# Patient Record
Sex: Female | Born: 1965 | Race: White | Hispanic: No | Marital: Married | Smoking: Never smoker
Health system: Southern US, Community
[De-identification: ages and names within clinical notes are randomized; demographics above are authoritative.]

## PROBLEM LIST (undated history)

## (undated) DIAGNOSIS — M1990 Unspecified osteoarthritis, unspecified site: Secondary | ICD-10-CM

## (undated) DIAGNOSIS — I2510 Atherosclerotic heart disease of native coronary artery without angina pectoris: Secondary | ICD-10-CM

## (undated) DIAGNOSIS — E785 Hyperlipidemia, unspecified: Secondary | ICD-10-CM

## (undated) DIAGNOSIS — I10 Essential (primary) hypertension: Secondary | ICD-10-CM

## (undated) DIAGNOSIS — M79602 Pain in left arm: Secondary | ICD-10-CM

## (undated) HISTORY — DX: Hyperlipidemia, unspecified: E78.5

## (undated) HISTORY — PX: CORONARY STENT PLACEMENT: SHX1402

## (undated) HISTORY — DX: Unspecified osteoarthritis, unspecified site: M19.90

## (undated) HISTORY — DX: Essential (primary) hypertension: I10

## (undated) HISTORY — DX: Atherosclerotic heart disease of native coronary artery without angina pectoris: I25.10

## (undated) HISTORY — DX: Pain in left arm: M79.602

---

## 2010-08-22 ENCOUNTER — Inpatient Hospital Stay (HOSPITAL_COMMUNITY): Admission: RE | Admit: 2010-08-22 | Discharge: 2010-08-25 | Payer: Self-pay | Admitting: Obstetrics and Gynecology

## 2010-08-25 ENCOUNTER — Encounter: Admission: RE | Admit: 2010-08-25 | Discharge: 2010-09-04 | Payer: Self-pay | Admitting: Obstetrics and Gynecology

## 2010-08-28 ENCOUNTER — Ambulatory Visit
Admission: RE | Admit: 2010-08-28 | Discharge: 2010-08-28 | Payer: Self-pay | Source: Home / Self Care | Admitting: Obstetrics and Gynecology

## 2011-02-06 LAB — CBC
MCH: 30.4 pg (ref 26.0–34.0)
MCH: 30.5 pg (ref 26.0–34.0)
MCHC: 33.6 g/dL (ref 30.0–36.0)
MCHC: 34 g/dL (ref 30.0–36.0)
MCV: 89.1 fL (ref 78.0–100.0)
Platelets: 177 10*3/uL (ref 150–400)
Platelets: 180 10*3/uL (ref 150–400)
RBC: 3.02 MIL/uL — ABNORMAL LOW (ref 3.87–5.11)
RDW: 16.8 % — ABNORMAL HIGH (ref 11.5–15.5)
RDW: 17.3 % — ABNORMAL HIGH (ref 11.5–15.5)

## 2011-02-06 LAB — RPR: RPR Ser Ql: NONREACTIVE

## 2011-05-30 ENCOUNTER — Telehealth: Payer: Self-pay | Admitting: Cardiovascular Disease

## 2011-05-30 NOTE — Telephone Encounter (Addendum)
ROI faxed to [facility] @ [phone] & [facility] at [phone] 05/30/11/km  Records received from Both [facility] & [facility] gave to [facility] 05/30/11/km

## 2011-06-05 ENCOUNTER — Encounter: Payer: Self-pay | Admitting: Cardiovascular Disease

## 2011-06-06 ENCOUNTER — Encounter: Payer: Self-pay | Admitting: Cardiovascular Disease

## 2011-06-06 ENCOUNTER — Ambulatory Visit (INDEPENDENT_AMBULATORY_CARE_PROVIDER_SITE_OTHER): Payer: Managed Care, Other (non HMO) | Admitting: Cardiovascular Disease

## 2011-06-06 DIAGNOSIS — E782 Mixed hyperlipidemia: Secondary | ICD-10-CM

## 2011-06-06 DIAGNOSIS — I10 Essential (primary) hypertension: Secondary | ICD-10-CM | POA: Insufficient documentation

## 2011-06-06 DIAGNOSIS — R079 Chest pain, unspecified: Secondary | ICD-10-CM | POA: Insufficient documentation

## 2011-06-06 NOTE — Assessment & Plan Note (Signed)
Cholesterol is at goal.  Continue current dose of statin and diet Rx.  No myalgias or side effects.  F/U  LFT's in 6 months. No results found for this basename: LDLCALC  LDL 161 on 11/11 and started back on statin which she had stopped during pregnancy

## 2011-06-06 NOTE — Patient Instructions (Signed)
Your physician has requested that you have cardiac CT. Cardiac computed tomography (CT) is a painless test that uses an x-ray machine to take clear, detailed pictures of your heart. For further information please visit www.cardiosmart.org. Please follow instruction sheet as given.   

## 2011-06-06 NOTE — Assessment & Plan Note (Signed)
Well controlled.  Continue current medications and low sodium Dash type diet.    

## 2011-06-06 NOTE — Progress Notes (Signed)
45 yo self referred for 2nd opinion regarding heart cath.  Seen at [facility] cards in March.  Had SSCP radiating to neck.  CRF;s elevated cholesterol, HTN  And family history.  She apparantly had a CT 5 years ago with "coronary plaque".  Reviewed calcium score done 06/2006 and score was only 5.6 with one plaque in circumflex.   Reviewed records from echart and [facility].  Stress echo with 1 mm ST segment depression and ? Apical hypokinesis Since January has had exertional left neck, chest and shoulder pain radiating to arm.  Always exertional.  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 Not clearly positional.  Pain is persistant and not improving  She clearly needs further w/u given risk factors new onset and persistant exertional pain and positive stress echo.  She wants to avoid invasive cath.  Would be reasonable to do cardiac CT since probability of disease is still low to moderate and not high.  Would also be able to compare calcium score form 5 years ago.    ROS: Denies fever, malais, weight loss, blurry vision, decreased visual acuity, cough, sputum, SOB, hemoptysis, pleuritic pain, palpitaitons, heartburn, abdominal pain, melena, lower extremity edema, claudication, or rash.  All other systems reviewed and negative   General: Affect appropriate Healthy:  appears stated age HEENT: normal Neck supple with no adenopathy JVP normal no bruits no thyromegaly Lungs clear with no wheezing and good diaphragmatic motion Heart:  S1/S2 no murmur,rub, gallop or click PMI normal Abdomen: benighn, BS positve, no tenderness, no AAA no bruit.  No HSM or HJR Distal pulses intact with no bruits No edema Neuro non-focal Skin warm and dry No muscular weakness  Medications No current outpatient prescriptions on file.    Allergies Review of patient's allergies indicates not on file.  Family History: Family History  Problem Relation Age of Onset  . Heart disease Neg Hx     Social History: History     Social History  . Marital Status: Married    Spouse Name: N/A    Number of Children: N/A  . Years of Education: N/A   Occupational History  . Not on file.   Social History Main Topics  . Smoking status: Never Smoker   . Smokeless tobacco: Not on file  . Alcohol Use: Yes  . Drug Use: No  . Sexually Active:    Other Topics Concern  . Not on file   Social History Narrative  . No narrative on file    Electrocardiogram:  NSR 73 normal ECG 05/19/11  Assessment and Plan

## 2011-06-06 NOTE — Assessment & Plan Note (Signed)
Will try to schedule cardiac CT.  If not approved will need invasive cath as suggested by [facility] cardiology

## 2011-06-12 ENCOUNTER — Encounter (HOSPITAL_COMMUNITY): Payer: Self-pay

## 2011-06-12 ENCOUNTER — Telehealth: Payer: Self-pay | Admitting: *Deleted

## 2011-06-12 ENCOUNTER — Ambulatory Visit (HOSPITAL_COMMUNITY)
Admission: RE | Admit: 2011-06-12 | Discharge: 2011-06-12 | Disposition: A | Discharge status: Home / Self Care | Payer: Managed Care, Other (non HMO) | Source: Ambulatory Visit | Attending: Cardiovascular Disease | Admitting: Cardiovascular Disease

## 2011-06-12 DIAGNOSIS — I10 Essential (primary) hypertension: Secondary | ICD-10-CM

## 2011-06-12 DIAGNOSIS — R079 Chest pain, unspecified: Secondary | ICD-10-CM

## 2011-06-12 DIAGNOSIS — E782 Mixed hyperlipidemia: Secondary | ICD-10-CM | POA: Insufficient documentation

## 2011-06-12 MED ORDER — IOHEXOL 350 MG/ML SOLN
80.0000 mL | Freq: Once | INTRAVENOUS | Status: AC | PRN
  Administered 2011-06-12: 80 mL via INTRAVENOUS

## 2011-06-12 NOTE — Telephone Encounter (Signed)
Dr Eden Emms called, he did a cardiac CT on the pt today and she will need to have a left heart cath in the [facility] lab next week with him. The pt will need to call back to schedule Deliah Goody

## 2011-06-13 ENCOUNTER — Other Ambulatory Visit: Payer: Self-pay | Admitting: *Deleted

## 2011-06-13 ENCOUNTER — Encounter: Payer: Self-pay | Admitting: *Deleted

## 2011-06-13 DIAGNOSIS — Z01810 Encounter for preprocedural cardiovascular examination: Secondary | ICD-10-CM

## 2011-06-13 DIAGNOSIS — R931 Abnormal findings on diagnostic imaging of heart and coronary circulation: Secondary | ICD-10-CM

## 2011-06-13 NOTE — Telephone Encounter (Signed)
PT CALLED THIS AM LEFT HEART SCHEDULED WITH DR Eden Emms  ON 06-20-11 AT 9:30 AM  PT AWARE AND WILL HAVE PRE PROCEDURE LABS DONE ON MON 06-16-11 AT 3:30 PM

## 2011-06-16 ENCOUNTER — Other Ambulatory Visit (INDEPENDENT_AMBULATORY_CARE_PROVIDER_SITE_OTHER): Payer: Managed Care, Other (non HMO) | Admitting: *Deleted

## 2011-06-16 DIAGNOSIS — Z01810 Encounter for preprocedural cardiovascular examination: Secondary | ICD-10-CM

## 2011-06-16 LAB — CBC WITH DIFFERENTIAL/PLATELET
Basophils Absolute: 0 10*3/uL (ref 0.0–0.1)
Basophils Relative: 0.5 % (ref 0.0–3.0)
Eosinophils Absolute: 0.1 10*3/uL (ref 0.0–0.7)
Eosinophils Relative: 1.2 % (ref 0.0–5.0)
HCT: 40.5 % (ref 36.0–46.0)
Hemoglobin: 13.8 g/dL (ref 12.0–15.0)
Lymphocytes Relative: 30.9 % (ref 12.0–46.0)
Lymphs Abs: 1.3 10*3/uL (ref 0.7–4.0)
MCHC: 33.9 g/dL (ref 30.0–36.0)
MCV: 94.5 fl (ref 78.0–100.0)
Monocytes Absolute: 0.4 10*3/uL (ref 0.1–1.0)
Monocytes Relative: 9.7 % (ref 3.0–12.0)
Neutro Abs: 2.4 10*3/uL (ref 1.4–7.7)
Neutrophils Relative %: 57.7 % (ref 43.0–77.0)
Platelets: 149 10*3/uL — ABNORMAL LOW (ref 150.0–400.0)
RBC: 4.29 Mil/uL (ref 3.87–5.11)
RDW: 13 % (ref 11.5–14.6)
WBC: 4.1 10*3/uL — ABNORMAL LOW (ref 4.5–10.5)

## 2011-06-16 LAB — BASIC METABOLIC PANEL
CO2: 29 mEq/L (ref 19–32)
Calcium: 8.8 mg/dL (ref 8.4–10.5)
Chloride: 98 mEq/L (ref 96–112)
Creatinine, Ser: 0.8 mg/dL (ref 0.4–1.2)
Glucose, Bld: 94 mg/dL (ref 70–99)

## 2011-06-16 LAB — APTT: aPTT: 28.7 s (ref 21.7–28.8)

## 2011-06-17 ENCOUNTER — Encounter: Payer: Self-pay | Admitting: *Deleted

## 2011-06-20 ENCOUNTER — Telehealth: Payer: Self-pay | Admitting: Cardiovascular Disease

## 2011-06-20 ENCOUNTER — Inpatient Hospital Stay (HOSPITAL_BASED_OUTPATIENT_CLINIC_OR_DEPARTMENT_OTHER)
Admission: RE | Admit: 2011-06-20 | Discharge: 2011-06-20 | Disposition: A | Discharge status: Home / Self Care | Payer: Managed Care, Other (non HMO) | Source: Ambulatory Visit | Attending: Cardiovascular Disease | Admitting: Cardiovascular Disease

## 2011-06-20 DIAGNOSIS — I2510 Atherosclerotic heart disease of native coronary artery without angina pectoris: Secondary | ICD-10-CM

## 2011-06-20 MED ORDER — PRASUGREL HCL 10 MG PO TABS: ORAL_TABLET | ORAL | Status: DC

## 2011-06-20 NOTE — Telephone Encounter (Signed)
Per Dr Eden Emms pt needs Effient 60 mg tonight then 10 mg daily rx sent into [facility] pt is aware

## 2011-06-20 NOTE — Telephone Encounter (Signed)
Was seen at [facility] for cath today/stent on Monday.  Prescription for blood thinner was suppose to be called into [facility] on [address].  This has not been done.  Please ck and call this in and advise patient when this is done.

## 2011-06-22 ENCOUNTER — Emergency Department (HOSPITAL_COMMUNITY)
Admission: EM | Admit: 2011-06-22 | Discharge: 2011-06-23 | Disposition: A | Discharge status: Home / Self Care | Payer: Managed Care, Other (non HMO) | Source: Home / Self Care | Attending: Emergency Medicine | Admitting: Emergency Medicine

## 2011-06-22 DIAGNOSIS — Z79899 Other long term (current) drug therapy: Secondary | ICD-10-CM | POA: Insufficient documentation

## 2011-06-22 DIAGNOSIS — I519 Heart disease, unspecified: Secondary | ICD-10-CM | POA: Insufficient documentation

## 2011-06-22 DIAGNOSIS — E7800 Pure hypercholesterolemia, unspecified: Secondary | ICD-10-CM | POA: Insufficient documentation

## 2011-06-22 DIAGNOSIS — I10 Essential (primary) hypertension: Secondary | ICD-10-CM | POA: Insufficient documentation

## 2011-06-22 DIAGNOSIS — Z7982 Long term (current) use of aspirin: Secondary | ICD-10-CM | POA: Insufficient documentation

## 2011-06-22 DIAGNOSIS — R110 Nausea: Secondary | ICD-10-CM | POA: Insufficient documentation

## 2011-06-22 DIAGNOSIS — G43909 Migraine, unspecified, not intractable, without status migrainosus: Secondary | ICD-10-CM | POA: Insufficient documentation

## 2011-06-23 ENCOUNTER — Ambulatory Visit (HOSPITAL_COMMUNITY)
Admission: RE | Admit: 2011-06-23 | Discharge: 2011-06-24 | Disposition: A | Discharge status: Home / Self Care | Payer: Managed Care, Other (non HMO) | Source: Ambulatory Visit | Attending: Cardiovascular Disease | Admitting: Cardiovascular Disease

## 2011-06-23 DIAGNOSIS — I2510 Atherosclerotic heart disease of native coronary artery without angina pectoris: Secondary | ICD-10-CM

## 2011-06-23 DIAGNOSIS — I2582 Chronic total occlusion of coronary artery: Secondary | ICD-10-CM | POA: Insufficient documentation

## 2011-06-23 DIAGNOSIS — I200 Unstable angina: Secondary | ICD-10-CM | POA: Insufficient documentation

## 2011-06-23 DIAGNOSIS — Z01812 Encounter for preprocedural laboratory examination: Secondary | ICD-10-CM | POA: Insufficient documentation

## 2011-06-23 DIAGNOSIS — Z01810 Encounter for preprocedural cardiovascular examination: Secondary | ICD-10-CM | POA: Insufficient documentation

## 2011-06-23 DIAGNOSIS — I498 Other specified cardiac arrhythmias: Secondary | ICD-10-CM | POA: Insufficient documentation

## 2011-06-23 DIAGNOSIS — E785 Hyperlipidemia, unspecified: Secondary | ICD-10-CM | POA: Insufficient documentation

## 2011-06-23 DIAGNOSIS — I10 Essential (primary) hypertension: Secondary | ICD-10-CM | POA: Insufficient documentation

## 2011-06-23 LAB — CBC
MCH: 31.3 pg (ref 26.0–34.0)
MCV: 91.1 fL (ref 78.0–100.0)
Platelets: 147 10*3/uL — ABNORMAL LOW (ref 150–400)
RBC: 4.16 MIL/uL (ref 3.87–5.11)
RDW: 12.5 % (ref 11.5–15.5)
WBC: 4.3 10*3/uL (ref 4.0–10.5)

## 2011-06-23 LAB — BASIC METABOLIC PANEL
BUN: 16 mg/dL (ref 6–23)
CO2: 26 mEq/L (ref 19–32)
Calcium: 8.9 mg/dL (ref 8.4–10.5)
Creatinine, Ser: 0.76 mg/dL (ref 0.50–1.10)
GFR calc non Af Amer: >60 mL/min (ref >60)
Glucose, Bld: 88 mg/dL (ref 70–99)
Sodium: 143 mEq/L (ref 135–145)

## 2011-06-23 LAB — PROTIME-INR: Prothrombin Time: 12.6 seconds (ref 11.6–15.2)

## 2011-06-24 DIAGNOSIS — I200 Unstable angina: Secondary | ICD-10-CM

## 2011-06-24 LAB — CBC
HCT: 37.9 % (ref 36.0–46.0)
MCH: 31 pg (ref 26.0–34.0)
MCHC: 33.8 g/dL (ref 30.0–36.0)
MCV: 91.8 fL (ref 78.0–100.0)
Platelets: 145 10*3/uL — ABNORMAL LOW (ref 150–400)
RDW: 12.6 % (ref 11.5–15.5)
WBC: 4.2 10*3/uL (ref 4.0–10.5)

## 2011-06-24 LAB — BASIC METABOLIC PANEL
BUN: 12 mg/dL (ref 6–23)
Calcium: 9 mg/dL (ref 8.4–10.5)
Chloride: 105 mEq/L (ref 96–112)
Creatinine, Ser: 0.88 mg/dL (ref 0.50–1.10)
GFR calc Af Amer: >60 mL/min (ref >60)
GFR calc non Af Amer: >60 mL/min (ref >60)

## 2011-06-24 NOTE — Cardiovascular Report (Signed)
NAMEROYALTI, SCHAUF NO.:  [id number]  MEDICAL RECORD NO.:  [id number]  LOCATION:  6525                         FACILITY:  [facility]  PHYSICIAN:  Verne Carrow, MDDATE OF BIRTH:  1966-01-29  DATE OF PROCEDURE:  06/23/2011 DATE OF DISCHARGE:                           CARDIAC CATHETERIZATION   PRIMARY CARDIOLOGIST:  Theron Arista C. Eden Emms, MD, [facility]  PROCEDURE PERFORMED: 1. Percutaneous transluminal coronary angioplasty with placement of     three overlapping drug-eluting stents in the first diagonal branch     of the left anterior descending artery. 2. Placement of an Angio-Seal femoral artery closure device.  OPERATOR:  Verne Carrow, MD  INDICATIONS:  This is a 45 year old Caucasian female with recent jaw pain consistent with unstable angina.  She underwent a diagnostic catheterization per Dr. Charlton Haws in the outpatient cath lab last week.  She was found to have a totally occluded circumflex artery which was felt to be chronic secondary to collateralization.  She was also found to have a high-grade stenosis in the first diagonal branch. Percutaneous intervention was arranged for today.  PROCEDURE IN DETAIL:  The patient was brought to the main cardiac catheterization laboratory after signing informed consent for the procedure.  The right groin was prepped, draped in sterile fashion. Lidocaine 1% was used for local anesthesia.  A 6-French sheath was inserted into the right femoral artery without difficulty.  The patient was given a bolus of Angiomax and a drip started.  The patient had been loaded with 300 mg of Plavix earlier this morning.  Of note, she has been loaded with Effient last week and has taken Effient throughout the weekend.  However, she has felt nausea and felt that this was related to her Effient.  She did not want to retake the Effient today.  When the ACT was greater than 200, I selectively engaged the left main artery with an XB  LAD 3.5 guiding catheter.  A cougar intracoronary wire was passed down the LAD and into the diagonal branch but we were unable to cross the stenosis.  We then used a whisper wire to cross the stenosis in the diagonal branch.  A 2.0 x 8-mm balloon was used to predilate the lesion.  Flow was totally shut down in the vessel with placement of the wire across the tight stenosis.  Therefore, it was difficult to center the balloon over the lesion.  After several balloon inflations, flow was restored in the vessel.  I originally had hoped to leave this as a balloon angioplasty alone, however, there appeared to be haziness in the proximal vessel consistent with dissection.  We chose to place a stent. A 2.25 x 12 mm Resolute Integrity drug-eluting stent was positioned in the proximal portion of the vessel and deployed.  I did not come all the way back to the ostium as I did not feel that the ostium had severe disease.  After this stent was postdilated with a 2.25 x 8 mm noncompliant balloon, I was concerned about the ostium of the vessel. We then placed a 2.25 x 8 mm Resolute Integrity drug-eluting stent in the ostium of the diagonal branch overlapping with the first  stent. There then appeared to be a dissection beyond the stent.  A 2.25 x 18 mm Resolute Integrity drug-eluting stent was positioned in the mid vessel overlapping with the previously placed stents and was deployed without difficulty.  The stenosis was taken from 99% down to 0%.  There was an excellent flow into the vessel.  I do not feel there was any residual dissection flap.  The patient tolerated the procedure well.  She was without any symptoms at the time of leaving the cath lab.  There were no immediate complications.  The patient was hemodynamically stable.  IMPRESSION:  Successful percutaneous transluminal coronary angioplasty with placement of three overlapping drug-eluting stents in the diagonal branch of the left anterior  descending artery.  RECOMMENDATIONS:  The patient should be continued on aspirin, Plavix and statin.  We will not restart Effient as the patient felt that this caused her to be nauseous.  We will monitor her on the heart monitor tonight and if her heart rate tolerates, we will add low-dose beta- blocker.     Verne Carrow, MD     CM/MEDQ  D:  06/23/2011  T:  06/23/2011  Job:  [id number]  cc:   Noralyn Pick. Eden Emms, MD, [facility]  Electronically Signed by Verne Carrow MD on 06/24/2011 01:45:50 PM

## 2011-06-25 NOTE — Discharge Summary (Addendum)
NAMEMARIKA, MAHAFFY NO.:  [id number]  MEDICAL RECORD NO.:  [id number]  LOCATION:  6525                         FACILITY:  [facility]  PHYSICIAN:  Verne Carrow, MDDATE OF BIRTH:  1966/10/06  DATE OF ADMISSION:  06/23/2011 DATE OF DISCHARGE:  06/24/2011                              DISCHARGE SUMMARY   DISCHARGE DIAGNOSES: 1. Unstable angina. 2. Newly diagnosed coronary artery disease.     a.     Abnormal coronary CT as an outpatient, leading to diagnostic      catheterization June 20, 2011, showing chronically occluded      circumflex, 90% diagonal stenosis.     b.     Status post PTCA with 3 overlapping drug-eluting stents,      diagnosed June 23, 2011.     c.     The patient had nausea with Effient, changed to Plavix. 3. Hypertension. 4. Hyperlipidemia. 5. Sinus bradycardia.  HOSPITAL COURSE:  Ms. Acheampong is a 45 year old female with a history of hyperlipidemia and hypertension.  She was seen with the same Cardiology in March and had substernal chest pain radiating to her neck.  She apparently had CT 5 years ago with coronary plaque.  She did have a stress echo with 1-mm ST-segment depression and questionable apical hypokinesis.  Since January, she has had exertional left neck, chest, and shoulder pain radiating to her arm.  She was subsequently referred for cardiac CT by Dr. Eden Emms demonstrating very high calcium score of 397 for age and sex-match control precisely, significant stenosis with plaque in the proximal circumflex artery.  Arrangements to have catheterization was made and she was brought in for the diagnostic portion on June 20, 2011 demonstrating the above findings.  She was loaded with Effient.  However, she did have nausea with Effient, so that this was changed to Plavix upon admission in the hospital at this time. She ended up with successful PTCA and placement of 3 overlapping drug- eluting stents in the diagonal of the LAD.  She was  slightly bradycardic with heart rates in the 60s, and low-dose  beta blockade has been considered at discharge.  Dr. Clifton James has seen and examined her today and felt she is stable for discharge.  DISCHARGE LABORATORY DATA:  WBC 4.2, hemoglobin 12.8, hematocrit 37.9, and platelet count 145.  Sodium 140, potassium 3.6, chloride 105, CO2 26, glucose 102, BUN 12, and creatinine 0.88.  Urine hCG was negative.  STUDIES:  Cardiac catheterization on June 23, 2011, please see followup report for details.  DISCHARGE MEDICATIONS: 1. Plavix 75 mg daily. 2. Metoprolol tartrate 25 mg half tablet b.i.d. 3. Nitroglycerin sublingual 0.4 mg every 5 minutes as needed up to 3     doses. 4. Protonix 40 mg daily switched out for Prilosec given the __________ 5. Aspirin 81 mg daily. 6. Atorvastatin 20 mg daily. 7. HCTZ 12.5 mg daily. 8. Acyclovir 500 mg daily.  DISPOSITION:  Ms. Sprague will be discharged in stable condition to home. She is to increase activity slowly and is not to lift anything over 5 pounds for 1 week, drive for 2 days or  participate in sexual activity for 1 week.  She is to follow a low-sodium, heart-healthy diet and to call us as soon as she notices any pain, swelling, bleeding, or pus at her cath site .  She will follow up with Dr. Eden Emms as an outpatient and our office will call her with this appointment.  She will also be referred to cardiac rehab.  DURATION OF DISCHARGE ENCOUNTER:  Greater than 30 minutes including physician and PA time.     Ronie Spies, P.A.C.   ______________________________ Verne Carrow, MD    DD/MEDQ  D:  06/24/2011  T:  06/24/2011  Job:  [id number]  cc:   Noralyn Pick. Eden Emms, MD, [facility]  Electronically Signed by Verne Carrow MD on 06/25/2011 02:44:53 PM Electronically Signed by Ronie Spies  on 07/02/2011 11:25:48 AM

## 2011-06-27 ENCOUNTER — Telehealth: Payer: Self-pay | Admitting: Cardiovascular Disease

## 2011-06-27 NOTE — Telephone Encounter (Signed)
Patient had a cardiac cath last Friday and a stent placement on Monday. For the last two days she has been having a sensation like her left arm is going to sleep from shoulder down to her hand with a tingling sensation on last two outer fingers. This sensation is coming and going, she feels it more this morning, patient works in the computer typing She wants to know if it is normal. Patient denies any other symptoms.  Payton Emerald RN left pt a message saying  that those symptoms are normal, because during the procedure pt was in a same position with arm extended for about an hour during a procedure so is possible that temporarily   compromise nerve  of the arm and is possible to have those symptoms. If  Symptoms continue or get worse, to call the office back. I call pt back to check if she understand the message. She verbalized understanding.

## 2011-06-27 NOTE — Telephone Encounter (Signed)
PT had heart cath and Friday and stents on Monday and she is experiencing some numbness in her left arm and she was wondering was that normal

## 2011-07-02 ENCOUNTER — Telehealth: Payer: Self-pay | Admitting: Cardiovascular Disease

## 2011-07-02 NOTE — Telephone Encounter (Signed)
Pt has questions regarding cologen plug that is put in the site after a cath dose it disolve or dose it stay there forever

## 2011-07-02 NOTE — Telephone Encounter (Signed)
Spoke with pt, per mark in the [facility] lab the Calogen plug will eventually disolve Yesenia Evans

## 2011-07-07 ENCOUNTER — Telehealth: Payer: Self-pay | Admitting: Cardiovascular Disease

## 2011-07-07 MED ORDER — CLOPIDOGREL BISULFATE 75 MG PO TABS: 75.0000 mg | ORAL_TABLET | Freq: Every day | ORAL | Status: DC

## 2011-07-07 MED ORDER — METOPROLOL TARTRATE 25 MG PO TABS: ORAL_TABLET | ORAL | Status: DC

## 2011-07-07 NOTE — Telephone Encounter (Signed)
Left a message to call back.

## 2011-07-07 NOTE — Telephone Encounter (Signed)
CALLED IN GEN PLAVIX  75 MG  METOPROLOL 25 MG 1/2 TAB BID  7 DAYS  WORTH  PER PT AWAITING ./CY MAIL ORDER

## 2011-07-07 NOTE — Telephone Encounter (Signed)
Per pt call, pt ordered RX through mail order and according to company, RX was mailed on Friday. Pt wanted to have a week supply of RX on hand if RX was not received in appropriate time. Pt only has two pills left. RX pt needs are Metoprolol 25mg  and (Generic for Plavix) Clopidogrel 75mg . Pt wants to know if she could potentially get a one week emergency supply of RX in the instance that pt RX mail order was not received in time. Please return pt call to advise/discuss.

## 2011-07-10 ENCOUNTER — Telehealth: Payer: Self-pay | Admitting: Cardiovascular Disease

## 2011-07-10 NOTE — Telephone Encounter (Signed)
Prior auth done and approved. Pt made aware Deliah Goody

## 2011-07-10 NOTE — Telephone Encounter (Signed)
Pt talk to [facility] and they need to talk to our office prior to filling Rx for pantoprazole 40mg  prn. Per pt they said they have tried to contact us but have been unsuccessful so she was calling to give the information. Please contact [facility] [phone] Rx # [id number]

## 2011-07-11 ENCOUNTER — Encounter: Payer: Self-pay | Admitting: Cardiovascular Disease

## 2011-07-14 ENCOUNTER — Encounter (HOSPITAL_COMMUNITY)
Admission: RE | Admit: 2011-07-14 | Discharge: 2011-07-14 | Disposition: A | Discharge status: Home / Self Care | Payer: Managed Care, Other (non HMO) | Source: Ambulatory Visit | Attending: Cardiovascular Disease | Admitting: Cardiovascular Disease

## 2011-07-14 DIAGNOSIS — I498 Other specified cardiac arrhythmias: Secondary | ICD-10-CM | POA: Insufficient documentation

## 2011-07-14 DIAGNOSIS — Z9861 Coronary angioplasty status: Secondary | ICD-10-CM | POA: Insufficient documentation

## 2011-07-14 DIAGNOSIS — Z5189 Encounter for other specified aftercare: Secondary | ICD-10-CM | POA: Insufficient documentation

## 2011-07-14 DIAGNOSIS — I2582 Chronic total occlusion of coronary artery: Secondary | ICD-10-CM | POA: Insufficient documentation

## 2011-07-14 DIAGNOSIS — I10 Essential (primary) hypertension: Secondary | ICD-10-CM | POA: Insufficient documentation

## 2011-07-14 DIAGNOSIS — I200 Unstable angina: Secondary | ICD-10-CM | POA: Insufficient documentation

## 2011-07-14 DIAGNOSIS — I2510 Atherosclerotic heart disease of native coronary artery without angina pectoris: Secondary | ICD-10-CM | POA: Insufficient documentation

## 2011-07-14 DIAGNOSIS — E785 Hyperlipidemia, unspecified: Secondary | ICD-10-CM | POA: Insufficient documentation

## 2011-07-15 NOTE — Cardiovascular Report (Signed)
  NAMENOELY, KUHNLE                 ACCOUNT NO.:  [id number]  MEDICAL RECORD NO.:  [id number]  LOCATION:                                 FACILITY:  PHYSICIAN:  Noralyn Pick. Eden Emms, MD, FACCDATE OF BIRTH:  [birth year]  DATE OF PROCEDURE:  06/20/2011 DATE OF DISCHARGE:                           CARDIAC CATHETERIZATION   PROCEDURE:  Coronary arteriography.  OPERATOR:  Noralyn Pick. Eden Emms, MD, [facility]  INDICATIONS:  Chest pain with abnormal cardiac CT suggesting high-grade lesion in the circumflex coronary artery.  Cine catheterization done with 4-French catheters from right femoral artery.  Left main coronary artery was normal.  Left anterior descending artery was normal.  First diagonal branch had a 90% discrete stenosis.  The vessel was somewhat small in the 2-2.5 mm range.  Circumflex artery was 100% occluded proximally.  The obtuse marginal branch and proximal circ filled by collaterals from the right coronary artery.  Right coronary artery was large and dominant.  It was normal.  RAO ventriculography:  RAO ventriculography was normal.  EF was 60%. There was no gradient across aortic valve.  No MR.  LV pressure was 131/13 and aortic pressure was 133/79.  IMPRESSION:  The patient has a chronically occluded circumflex branch. The cardiac CT suggested a high-grade lesion, likely because of retrograde filling of the dye.  The circumflex occlusion is chronic and fairly well collateralized.  I believe her recent chest pain is from her diagonal branch.  She will be loaded with Effient.  She will come back to the lab on Monday for angioplasty of the diagonal branch.  It may be large enough to put a 2.5 stent in.  I will review the films with Dr. Clifton James who will be doing her procedure on Monday.  She tolerated the diagnostic procedure well.     Noralyn Pick. Eden Emms, MD, [facility]    PCN/MEDQ  D:  06/20/2011  T:  06/20/2011  Job:  [id number]  Electronically Signed by Charlton Haws MD [facility] on  07/15/2011 09:21:20 AM

## 2011-07-16 ENCOUNTER — Encounter (HOSPITAL_COMMUNITY): Payer: Managed Care, Other (non HMO)

## 2011-07-18 ENCOUNTER — Encounter (HOSPITAL_COMMUNITY): Payer: Managed Care, Other (non HMO)

## 2011-07-21 ENCOUNTER — Encounter (HOSPITAL_COMMUNITY): Payer: Managed Care, Other (non HMO)

## 2011-07-22 ENCOUNTER — Ambulatory Visit (INDEPENDENT_AMBULATORY_CARE_PROVIDER_SITE_OTHER): Payer: Managed Care, Other (non HMO) | Admitting: Cardiovascular Disease

## 2011-07-22 ENCOUNTER — Encounter: Payer: Self-pay | Admitting: Cardiovascular Disease

## 2011-07-22 DIAGNOSIS — R079 Chest pain, unspecified: Secondary | ICD-10-CM

## 2011-07-22 DIAGNOSIS — E782 Mixed hyperlipidemia: Secondary | ICD-10-CM

## 2011-07-22 DIAGNOSIS — I10 Essential (primary) hypertension: Secondary | ICD-10-CM

## 2011-07-22 NOTE — Assessment & Plan Note (Signed)
Well controlled.  Continue current medications and low sodium Dash type diet.    

## 2011-07-22 NOTE — Assessment & Plan Note (Signed)
Cholesterol is at goal.  Continue current dose of statin and diet Rx.  No myalgias or side effects.  F/U  LFT's in 6 months. No results found for this basename: LDLCALC   F/U labs in 6 months

## 2011-07-22 NOTE — Patient Instructions (Addendum)
Your physician recommends that you schedule a follow-up appointment in: 6 months with dr Eden Emms  Your physician recommends that you continue on your current medications as directed. Please refer to the Current Medication list given to you today.

## 2011-07-22 NOTE — Assessment & Plan Note (Signed)
Reviewed diagnostic cath 7/27 and intervention 7/28.  Well revascularized.  Good collaterals to circumflex.  Normal EF. Continue Plavix.

## 2011-07-22 NOTE — Progress Notes (Signed)
45 yo with SSCP.  CArdiac CT suggesting high grade lesion in circumflex.  Cath 7/27 with collaterlized circ and significant lesion in D1.  7/31 had 3 overlapping stents to D1.  Effient caused nausea so on Plavix.  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  Rehab not ideal as participants are all older but she is going.  Easy bruising on Plavix.  No dyspnea.  Compliant with meds.  BP under good control.     ROS: Denies fever, malais, weight loss, blurry vision, decreased visual acuity, cough, sputum, SOB, hemoptysis, pleuritic pain, palpitaitons, heartburn, abdominal pain, melena, lower extremity edema, claudication, or rash.  All other systems reviewed and negative  General: Affect appropriate Healthy:  appears stated age HEENT: normal Neck supple with no adenopathy JVP normal no bruits no thyromegaly Lungs clear with no wheezing and good diaphragmatic motion Heart:  S1/S2 no murmur,rub, gallop or click PMI normal Abdomen: benighn, BS positve, no tenderness, no AAA no bruit.  No HSM or HJR Distal pulses intact with no bruits No edema Neuro non-focal Skin warm and dry No muscular weakness   Current Outpatient Prescriptions  Medication Sig Dispense Refill  . aspirin 81 MG tablet Take 81 mg by mouth daily.        Marland Kitchen atorvastatin (LIPITOR) 40 MG tablet Take 20 mg by mouth Daily.       . clopidogrel (PLAVIX) 75 MG tablet Take 1 tablet (75 mg total) by mouth daily.  7 tablet  0  . hydrochlorothiazide (,MICROZIDE/HYDRODIURIL,) 12.5 MG capsule Take 1 tablet by mouth Daily.      . metoprolol tartrate (LOPRESSOR) 25 MG tablet 1/2 TAB TWICE DAILY  7 tablet  0  . nitroGLYCERIN (NITROSTAT) 0.4 MG SL tablet Place 0.4 mg under the tongue as needed.        . pantoprazole (PROTONIX) 40 MG tablet Take 40 mg by mouth as needed.        . valACYclovir (VALTREX) 500 MG tablet Take 500 mg by mouth daily.          Allergies  Adhesive and Ceclor  Electrocardiogram:  Assessment and Plan

## 2011-07-23 ENCOUNTER — Encounter (HOSPITAL_COMMUNITY): Payer: Managed Care, Other (non HMO)

## 2011-07-25 ENCOUNTER — Encounter (HOSPITAL_COMMUNITY): Payer: Managed Care, Other (non HMO)

## 2011-07-28 ENCOUNTER — Encounter (HOSPITAL_COMMUNITY): Payer: Managed Care, Other (non HMO)

## 2011-07-30 ENCOUNTER — Encounter (HOSPITAL_COMMUNITY): Payer: Managed Care, Other (non HMO) | Attending: Cardiovascular Disease

## 2011-07-30 DIAGNOSIS — I200 Unstable angina: Secondary | ICD-10-CM | POA: Insufficient documentation

## 2011-07-30 DIAGNOSIS — I498 Other specified cardiac arrhythmias: Secondary | ICD-10-CM | POA: Insufficient documentation

## 2011-07-30 DIAGNOSIS — I10 Essential (primary) hypertension: Secondary | ICD-10-CM | POA: Insufficient documentation

## 2011-07-30 DIAGNOSIS — I2510 Atherosclerotic heart disease of native coronary artery without angina pectoris: Secondary | ICD-10-CM | POA: Insufficient documentation

## 2011-07-30 DIAGNOSIS — I2582 Chronic total occlusion of coronary artery: Secondary | ICD-10-CM | POA: Insufficient documentation

## 2011-07-30 DIAGNOSIS — Z9861 Coronary angioplasty status: Secondary | ICD-10-CM | POA: Insufficient documentation

## 2011-07-30 DIAGNOSIS — E785 Hyperlipidemia, unspecified: Secondary | ICD-10-CM | POA: Insufficient documentation

## 2011-07-30 DIAGNOSIS — Z5189 Encounter for other specified aftercare: Secondary | ICD-10-CM | POA: Insufficient documentation

## 2011-07-31 ENCOUNTER — Telehealth: Payer: Self-pay | Admitting: Cardiovascular Disease

## 2011-07-31 ENCOUNTER — Emergency Department (HOSPITAL_COMMUNITY)
Admission: EM | Admit: 2011-07-31 | Discharge: 2011-08-01 | Disposition: A | Discharge status: Home / Self Care | Payer: Managed Care, Other (non HMO) | Attending: Emergency Medicine | Admitting: Emergency Medicine

## 2011-07-31 DIAGNOSIS — X58XXXA Exposure to other specified factors, initial encounter: Secondary | ICD-10-CM | POA: Insufficient documentation

## 2011-07-31 DIAGNOSIS — I519 Heart disease, unspecified: Secondary | ICD-10-CM | POA: Insufficient documentation

## 2011-07-31 DIAGNOSIS — R109 Unspecified abdominal pain: Secondary | ICD-10-CM | POA: Insufficient documentation

## 2011-07-31 DIAGNOSIS — I10 Essential (primary) hypertension: Secondary | ICD-10-CM | POA: Insufficient documentation

## 2011-07-31 DIAGNOSIS — S335XXA Sprain of ligaments of lumbar spine, initial encounter: Secondary | ICD-10-CM | POA: Insufficient documentation

## 2011-07-31 DIAGNOSIS — I2510 Atherosclerotic heart disease of native coronary artery without angina pectoris: Secondary | ICD-10-CM | POA: Insufficient documentation

## 2011-07-31 DIAGNOSIS — K5900 Constipation, unspecified: Secondary | ICD-10-CM | POA: Insufficient documentation

## 2011-07-31 DIAGNOSIS — Z79899 Other long term (current) drug therapy: Secondary | ICD-10-CM | POA: Insufficient documentation

## 2011-07-31 DIAGNOSIS — E789 Disorder of lipoprotein metabolism, unspecified: Secondary | ICD-10-CM | POA: Insufficient documentation

## 2011-07-31 LAB — CBC
HCT: 38.6 % (ref 36.0–46.0)
Hemoglobin: 13.4 g/dL (ref 12.0–15.0)
RBC: 4.26 MIL/uL (ref 3.87–5.11)
WBC: 5.1 10*3/uL (ref 4.0–10.5)

## 2011-07-31 LAB — POCT PREGNANCY, URINE: Preg Test, Ur: NEGATIVE

## 2011-07-31 LAB — DIFFERENTIAL
Basophils Absolute: 0 10*3/uL (ref 0.0–0.1)
Basophils Relative: 0 % (ref 0–1)
Lymphocytes Relative: 28 % (ref 12–46)
Neutro Abs: 3.2 10*3/uL (ref 1.7–7.7)
Neutrophils Relative %: 63 % (ref 43–77)

## 2011-07-31 LAB — URINALYSIS, ROUTINE W REFLEX MICROSCOPIC
Bilirubin Urine: NEGATIVE
Hgb urine dipstick: NEGATIVE
Ketones, ur: NEGATIVE mg/dL
Nitrite: NEGATIVE
Protein, ur: NEGATIVE mg/dL
Specific Gravity, Urine: 1 — ABNORMAL LOW (ref 1.005–1.030)
Urobilinogen, UA: 0.2 mg/dL (ref 0.0–1.0)

## 2011-07-31 NOTE — Telephone Encounter (Signed)
Pt calling c/o pains in lower back, pt said it could be muscular but pt has never had this type of pain before, it is pain around pt kidneys. Pt wanted to make sure there were no side effects from the medication that pt recently started taking per Dr. Fabio Bering order, that would result in pt experiencing lower back pain. Please return call to discuss/adivse.

## 2011-07-31 NOTE — Telephone Encounter (Signed)
Spoke with pt, she developed a discomfort in her back on Friday and now it is also in her right back, around the level of her kidneys. She questioned if maybe due to metoprolol or plavix. Drug info on both drugs do not have this listed as a side effect. She states that movement makes it worse. Pt referred to primary md if cont. No med changes made Yesenia Evans

## 2011-08-01 ENCOUNTER — Encounter (HOSPITAL_COMMUNITY): Payer: Managed Care, Other (non HMO)

## 2011-08-01 ENCOUNTER — Emergency Department (HOSPITAL_COMMUNITY): Payer: Managed Care, Other (non HMO)

## 2011-08-01 LAB — COMPREHENSIVE METABOLIC PANEL
ALT: 18 U/L (ref 0–35)
Alkaline Phosphatase: 72 U/L (ref 39–117)
BUN: 13 mg/dL (ref 6–23)
Chloride: 102 mEq/L (ref 96–112)
GFR calc Af Amer: >60 mL/min (ref >60)
Glucose, Bld: 94 mg/dL (ref 70–99)
Potassium: 3.4 mEq/L — ABNORMAL LOW (ref 3.5–5.1)
Sodium: 139 mEq/L (ref 135–145)
Total Bilirubin: 0.3 mg/dL (ref 0.3–1.2)

## 2011-08-01 LAB — LIPASE, BLOOD: Lipase: 59 U/L (ref 11–59)

## 2011-08-04 ENCOUNTER — Encounter (HOSPITAL_COMMUNITY): Payer: Managed Care, Other (non HMO)

## 2011-08-06 ENCOUNTER — Encounter (HOSPITAL_COMMUNITY): Payer: Managed Care, Other (non HMO)

## 2011-08-08 ENCOUNTER — Encounter (HOSPITAL_COMMUNITY): Payer: Managed Care, Other (non HMO)

## 2011-08-11 ENCOUNTER — Encounter (HOSPITAL_COMMUNITY): Payer: Managed Care, Other (non HMO)

## 2011-08-13 ENCOUNTER — Encounter (HOSPITAL_COMMUNITY): Payer: Managed Care, Other (non HMO)

## 2011-08-15 ENCOUNTER — Encounter (HOSPITAL_COMMUNITY): Payer: Managed Care, Other (non HMO)

## 2011-08-18 ENCOUNTER — Encounter (HOSPITAL_COMMUNITY): Payer: Managed Care, Other (non HMO)

## 2011-08-20 ENCOUNTER — Encounter (HOSPITAL_COMMUNITY): Payer: Managed Care, Other (non HMO)

## 2011-08-20 ENCOUNTER — Telehealth: Payer: Self-pay | Admitting: Cardiovascular Disease

## 2011-08-20 NOTE — Telephone Encounter (Signed)
Per Yesenia Evans pt has had left arm pain at every session. Just wanted to talk to you to fine out what to do.

## 2011-08-20 NOTE — Telephone Encounter (Signed)
Spoke with Yesenia Evans at rehab, the pt is consistently c/o a discomfort in Yesenia Evans left shoulder that goes down Yesenia Evans arm and can go up into Yesenia Evans jaw. This occurs usually on the treadmill at rehab but the pt has began to notice it at the other stations as well. The discomfort goes away immediately when she stop exercise. Rehab is going to fax over Yesenia Evans EKG's. appt made for pt to see [facility] next week. They will call back if the symptoms change Yesenia Evans

## 2011-08-22 ENCOUNTER — Encounter (HOSPITAL_COMMUNITY): Payer: Managed Care, Other (non HMO)

## 2011-08-25 ENCOUNTER — Encounter (HOSPITAL_COMMUNITY): Payer: Managed Care, Other (non HMO)

## 2011-08-25 ENCOUNTER — Encounter (HOSPITAL_COMMUNITY): Payer: Managed Care, Other (non HMO) | Attending: Cardiovascular Disease

## 2011-08-25 DIAGNOSIS — I2510 Atherosclerotic heart disease of native coronary artery without angina pectoris: Secondary | ICD-10-CM | POA: Insufficient documentation

## 2011-08-25 DIAGNOSIS — I200 Unstable angina: Secondary | ICD-10-CM | POA: Insufficient documentation

## 2011-08-25 DIAGNOSIS — Z9861 Coronary angioplasty status: Secondary | ICD-10-CM | POA: Insufficient documentation

## 2011-08-25 DIAGNOSIS — I498 Other specified cardiac arrhythmias: Secondary | ICD-10-CM | POA: Insufficient documentation

## 2011-08-25 DIAGNOSIS — I2582 Chronic total occlusion of coronary artery: Secondary | ICD-10-CM | POA: Insufficient documentation

## 2011-08-25 DIAGNOSIS — I10 Essential (primary) hypertension: Secondary | ICD-10-CM | POA: Insufficient documentation

## 2011-08-25 DIAGNOSIS — Z5189 Encounter for other specified aftercare: Secondary | ICD-10-CM | POA: Insufficient documentation

## 2011-08-25 DIAGNOSIS — E785 Hyperlipidemia, unspecified: Secondary | ICD-10-CM | POA: Insufficient documentation

## 2011-08-27 ENCOUNTER — Ambulatory Visit (INDEPENDENT_AMBULATORY_CARE_PROVIDER_SITE_OTHER): Payer: Managed Care, Other (non HMO) | Admitting: Physician Assistant

## 2011-08-27 ENCOUNTER — Encounter: Payer: Self-pay | Admitting: Physician Assistant

## 2011-08-27 ENCOUNTER — Encounter (HOSPITAL_COMMUNITY): Payer: Managed Care, Other (non HMO)

## 2011-08-27 DIAGNOSIS — M79609 Pain in unspecified limb: Secondary | ICD-10-CM

## 2011-08-27 DIAGNOSIS — I10 Essential (primary) hypertension: Secondary | ICD-10-CM

## 2011-08-27 DIAGNOSIS — I2510 Atherosclerotic heart disease of native coronary artery without angina pectoris: Secondary | ICD-10-CM

## 2011-08-27 DIAGNOSIS — E782 Mixed hyperlipidemia: Secondary | ICD-10-CM

## 2011-08-27 DIAGNOSIS — M79603 Pain in arm, unspecified: Secondary | ICD-10-CM

## 2011-08-27 MED ORDER — ISOSORBIDE MONONITRATE ER 30 MG PO TB24: 30.0000 mg | ORAL_TABLET | Freq: Every day | ORAL | Status: DC

## 2011-08-27 NOTE — Assessment & Plan Note (Signed)
Continue ASA, Plavix, statin and start Imdur as noted.

## 2011-08-27 NOTE — Assessment & Plan Note (Addendum)
I think she is describing angina.  Question if this is from the chronically occluded CFX.  Will start Imdur.  BPs at rehab look really good.  Will hold HCTZ to prevent hypotension.  Continue cardiac rehab.  Follow up in 2-3 weeks.  Will discuss with Dr. Eden Emms who has reviewed her films to decide if stress testing would be helpful.  If no response to anginal Rx, may need MRI of neck/shoulder.

## 2011-08-27 NOTE — Progress Notes (Signed)
History of Present Illness: Primary Cardiologist:  Dr. Charlton Haws   Yesenia Evans is a 45 y.o. female who presents for evaluation of left shoulder and arm pain with exertion at cardiac rehab.    She was evaluated in July by Dr. Eden Emms for chest pain.  Cardiac CT demonstrated high-grade circumflex lesion.  Cardiac catheterization 7/27: D1 90%, proximal circumflex occluded with right-to-left collaterals, EF 60%.  Her circumflex was felt to be a chronic occlusion and was treated medically.  She received 3 overlapping drug-eluting stents to the first diagonal.  Effient switched to Plavix due to nausea.  Last seen by Dr. Eden Emms 8/28.    Her initial symptom was left arm pain with exertion.  This symptom never went away after her PCI.  She only notes it when her HR goes up at cardiac rehab.  She does not have symptoms with lifting her kids or changing positions.  No pain with ROM.  No associated dyspnea, nausea or diaphoresis.  No syncope.  No orthopnea, PND or edema.  No chest pain.  Symptoms noted on exercise machines at cardiac rehab and she was referred back here today.  Past Medical History  Diagnosis Date  . Left arm pain   . CAD (coronary artery disease)     cath 05/2011: D1 90%, proximal circumflex occluded with right-to-left collaterals, EF 60%.  Her circumflex was felt to be a chronic occlusion and was treated medically.  She received 3 overlapping drug-eluting stents to the first diagonal  . Hypertension   . Hyperlipidemia   . Arthritis     Current Outpatient Prescriptions  Medication Sig Dispense Refill  . aspirin 81 MG tablet Take 81 mg by mouth daily.        . ATORVASTATIN CALCIUM PO Take 20 mg by mouth daily.        . clopidogrel (PLAVIX) 75 MG tablet Take 1 tablet (75 mg total) by mouth daily.  7 tablet  0  . metoprolol tartrate (LOPRESSOR) 25 MG tablet 1/2 TAB TWICE DAILY  7 tablet  0  . Multiple Vitamins-Minerals (MULTIVITAMIN WITH MINERALS) tablet Take 1 tablet by mouth daily.  Half tablet in morning half tablet at night       . nitroGLYCERIN (NITROSTAT) 0.4 MG SL tablet Place 0.4 mg under the tongue as needed.        . pantoprazole (PROTONIX) 40 MG tablet Take 40 mg by mouth as needed.       . valACYclovir (VALTREX) 500 MG tablet Take 500 mg by mouth daily.        . isosorbide mononitrate (IMDUR) 30 MG 24 hr tablet Take 1 tablet (30 mg total) by mouth daily.  30 tablet  11    Allergies: Allergies  Allergen Reactions  . Adhesive (Tape)   . Ceclor (Cefaclor)     Social Hx:  Non smoker  ROS:  Please see the history of present illness.  All other systems reviewed and negative.   Vital Signs: BP 138/92  Pulse 61  Ht 5\' 7"  (1.702 m)  Wt 185 lb 6.4 oz (84.097 kg)  BMI 29.04 kg/m2  PHYSICAL EXAM: Well nourished, well developed, in no acute distress HEENT: normal Neck: no JVD Vascular: no carotid bruits Cardiac:  normal S1, S2; RRR; no murmur Lungs:  clear to auscultation bilaterally, no wheezing, rhonchi or rales Abd: soft, nontender, no hepatomegaly Ext: no edema MSK:  Left shoulder without crepitus with PROM; no deformities; ? Tenderness over coracoid process; empty can  test without pain Skin: warm and dry Neuro:  CNs 2-12 intact, no focal abnormalities noted; strength in bilateral upper extremities normal and equal  EKG:  Sinus rhythm, rate 61, normal axis, no ischemic changes.  ASSESSMENT AND PLAN:

## 2011-08-27 NOTE — Patient Instructions (Signed)
Your physician recommends that you schedule a follow-up appointment in: 2-3 WEEKS WITH EITHER DR. Eden Emms OR SCOTT WEAVER, PA-C SAME DR. Eden Emms IN THE OFFICE.  Your physician has recommended you make the following change in your medication: START IMDUR 30 MG TAKE 1 TABLET DAILY; STOP TAKING HCTZ

## 2011-08-27 NOTE — Assessment & Plan Note (Signed)
-  Continue Lipitor °

## 2011-08-27 NOTE — Assessment & Plan Note (Signed)
BPs look great at cardiac rehab.  Little elevated today.

## 2011-08-29 ENCOUNTER — Encounter (HOSPITAL_COMMUNITY): Payer: Managed Care, Other (non HMO)

## 2011-08-29 ENCOUNTER — Telehealth: Payer: Self-pay | Admitting: Cardiovascular Disease

## 2011-08-29 NOTE — Telephone Encounter (Signed)
Spoke with pt, she will try taking 1/2 tablet for several days to see if that will help with the headaches. She will call if further problems Yesenia Evans

## 2011-08-29 NOTE — Telephone Encounter (Signed)
Pt saw Scott yesterday and started a new medication, Imdur.  Pt has questions about this.  REALLY bad headache/nausea.

## 2011-09-01 ENCOUNTER — Encounter (HOSPITAL_COMMUNITY): Payer: Managed Care, Other (non HMO)

## 2011-09-03 ENCOUNTER — Encounter (HOSPITAL_COMMUNITY): Payer: Managed Care, Other (non HMO)

## 2011-09-05 ENCOUNTER — Encounter (HOSPITAL_COMMUNITY): Payer: Managed Care, Other (non HMO)

## 2011-09-08 ENCOUNTER — Encounter (HOSPITAL_COMMUNITY): Payer: Managed Care, Other (non HMO)

## 2011-09-10 ENCOUNTER — Encounter (HOSPITAL_COMMUNITY): Payer: Managed Care, Other (non HMO)

## 2011-09-11 ENCOUNTER — Ambulatory Visit (INDEPENDENT_AMBULATORY_CARE_PROVIDER_SITE_OTHER): Payer: Managed Care, Other (non HMO) | Admitting: Cardiovascular Disease

## 2011-09-11 ENCOUNTER — Encounter: Payer: Self-pay | Admitting: Cardiovascular Disease

## 2011-09-11 DIAGNOSIS — I2510 Atherosclerotic heart disease of native coronary artery without angina pectoris: Secondary | ICD-10-CM

## 2011-09-11 DIAGNOSIS — E782 Mixed hyperlipidemia: Secondary | ICD-10-CM

## 2011-09-11 MED ORDER — LISINOPRIL 5 MG PO TABS: 5.0000 mg | ORAL_TABLET | Freq: Every day | ORAL | Status: DC

## 2011-09-11 NOTE — Assessment & Plan Note (Signed)
Cholesterol is at goal.  Continue current dose of statin and diet Rx.  No myalgias or side effects.  F/U  LFT's in 6 months. No results found for this basename: LDLCALC             

## 2011-09-11 NOTE — Patient Instructions (Addendum)
Your physician recommends that you schedule a follow-up appointment in: 8 WEEKS WITH DR [facility] Your physician has recommended you make the following change in your medication: STOP IMDUR START LISINOPRIL 5 MG EVERY DAY

## 2011-09-11 NOTE — Progress Notes (Signed)
45 yo with known CAD.  Collateralized circ with recent stent to diagonal.  Saw Scott couple weeks ago for arm pain.  ? Angina.  Started on imdur but made her feel horrible.  Did not seem to help pain.  No chest/arm pain when exercising and not using arms.  No prolonged pain  BP up a bit.  HCTZ stopped last visit to make sure BP not too low.  She has been going to cardiac rehab with no dyspnea palpitations or syncope.  Compliant with meds.  We will stop imdur and add lisinopril for BP.  F/U 6-8 weeks If she continue to have pain with exercises like recumbant bike and not involving UE arm movements will relook with cath  ROS: Denies fever, malais, weight loss, blurry vision, decreased visual acuity, cough, sputum, SOB, hemoptysis, pleuritic pain, palpitaitons, heartburn, abdominal pain, melena, lower extremity edema, claudication, or rash.  All other systems reviewed and negative  General: Affect appropriate Healthy:  appears stated age HEENT: normal Neck supple with no adenopathy JVP normal no bruits no thyromegaly Lungs clear with no wheezing and good diaphragmatic motion Heart:  S1/S2 no murmur,rub, gallop or click PMI normal Abdomen: benighn, BS positve, no tenderness, no AAA no bruit.  No HSM or HJR Distal pulses intact with no bruits No edema Neuro non-focal Skin warm and dry No muscular weakness   Current Outpatient Prescriptions  Medication Sig Dispense Refill  . aspirin 81 MG tablet Take 81 mg by mouth daily.        . ATORVASTATIN CALCIUM PO Take 20 mg by mouth daily.        . clopidogrel (PLAVIX) 75 MG tablet Take 1 tablet (75 mg total) by mouth daily.  7 tablet  0  . isosorbide mononitrate (IMDUR) 30 MG 24 hr tablet Take 1 tablet (30 mg total) by mouth daily.  30 tablet  11  . metoprolol tartrate (LOPRESSOR) 25 MG tablet 1/2 TAB TWICE DAILY  7 tablet  0  . Multiple Vitamins-Minerals (MULTIVITAMIN WITH MINERALS) tablet Take 1 tablet by mouth daily. Half tablet in morning half  tablet at night       . nitroGLYCERIN (NITROSTAT) 0.4 MG SL tablet Place 0.4 mg under the tongue as needed.        . pantoprazole (PROTONIX) 40 MG tablet Take 40 mg by mouth as needed.       . valACYclovir (VALTREX) 500 MG tablet Take 500 mg by mouth daily.          Allergies  Adhesive and Ceclor  Electrocardiogram:  08/27/11 NSR 61 normal ECG  Assessment and Plan

## 2011-09-11 NOTE — Assessment & Plan Note (Signed)
Add cozaar in lieu of vascular disease and diuretic being stopped

## 2011-09-11 NOTE — Assessment & Plan Note (Signed)
May need recath.  Will see response to sl nitro and LE exercise.  Continue ASA and Effient

## 2011-09-12 ENCOUNTER — Encounter (HOSPITAL_COMMUNITY): Payer: Managed Care, Other (non HMO)

## 2011-09-15 ENCOUNTER — Encounter (HOSPITAL_COMMUNITY): Payer: Managed Care, Other (non HMO)

## 2011-09-17 ENCOUNTER — Encounter (HOSPITAL_COMMUNITY): Payer: Managed Care, Other (non HMO)

## 2011-09-19 ENCOUNTER — Encounter (HOSPITAL_COMMUNITY): Payer: Managed Care, Other (non HMO)

## 2011-09-22 ENCOUNTER — Encounter (HOSPITAL_COMMUNITY): Payer: Managed Care, Other (non HMO)

## 2011-09-24 ENCOUNTER — Encounter (HOSPITAL_COMMUNITY): Payer: Managed Care, Other (non HMO)

## 2011-09-26 ENCOUNTER — Encounter (HOSPITAL_COMMUNITY): Payer: Managed Care, Other (non HMO)

## 2011-09-26 DIAGNOSIS — I10 Essential (primary) hypertension: Secondary | ICD-10-CM | POA: Insufficient documentation

## 2011-09-26 DIAGNOSIS — E785 Hyperlipidemia, unspecified: Secondary | ICD-10-CM | POA: Insufficient documentation

## 2011-09-26 DIAGNOSIS — I200 Unstable angina: Secondary | ICD-10-CM | POA: Insufficient documentation

## 2011-09-26 DIAGNOSIS — I2582 Chronic total occlusion of coronary artery: Secondary | ICD-10-CM | POA: Insufficient documentation

## 2011-09-26 DIAGNOSIS — I2510 Atherosclerotic heart disease of native coronary artery without angina pectoris: Secondary | ICD-10-CM | POA: Insufficient documentation

## 2011-09-26 DIAGNOSIS — Z9861 Coronary angioplasty status: Secondary | ICD-10-CM | POA: Insufficient documentation

## 2011-09-26 DIAGNOSIS — I498 Other specified cardiac arrhythmias: Secondary | ICD-10-CM | POA: Insufficient documentation

## 2011-09-26 DIAGNOSIS — Z5189 Encounter for other specified aftercare: Secondary | ICD-10-CM | POA: Insufficient documentation

## 2011-09-29 ENCOUNTER — Encounter (HOSPITAL_COMMUNITY): Payer: Managed Care, Other (non HMO)

## 2011-10-01 ENCOUNTER — Encounter (HOSPITAL_COMMUNITY): Payer: Managed Care, Other (non HMO)

## 2011-10-03 ENCOUNTER — Encounter (HOSPITAL_COMMUNITY): Payer: Managed Care, Other (non HMO)

## 2011-10-06 ENCOUNTER — Encounter (HOSPITAL_COMMUNITY): Payer: Managed Care, Other (non HMO)

## 2011-10-08 ENCOUNTER — Encounter (HOSPITAL_COMMUNITY): Payer: Managed Care, Other (non HMO)

## 2011-10-10 ENCOUNTER — Encounter (HOSPITAL_COMMUNITY): Payer: Managed Care, Other (non HMO)

## 2011-10-13 ENCOUNTER — Other Ambulatory Visit: Payer: Self-pay

## 2011-10-13 ENCOUNTER — Encounter (HOSPITAL_COMMUNITY)
Admission: RE | Admit: 2011-10-13 | Discharge: 2011-10-13 | Disposition: A | Discharge status: Home / Self Care | Payer: Managed Care, Other (non HMO) | Source: Ambulatory Visit | Attending: Cardiovascular Disease | Admitting: Cardiovascular Disease

## 2011-10-13 NOTE — Progress Notes (Addendum)
Pt c/o continued chronic left arm discomfort associated with exertion.  Describes as dull ache, quality unchanged from previous.  Pt reports she has previously taken motrin with minimal relief.  Pt was not able to tolerate imdur.  Pt reports she notices the discomfort more while exercising on the bike and notices it more on Monday after she has been actively lifting her young children all weekend.  Pt discomfort is noticeable however does not limit her activities.  Pt reports she has appt with Dr. Eden Emms 11/06/2011.  Spoke to Dr. Fabio Bering office, appt scheduled with Jacolyn Reedy, PA for 10/20/2011 @10 :00am.  Will keep Dr. Eden Emms appt as scheduled.  Pt informed  She has work conflict for 10/20/2011.  She will call to r/s the appt..  Pt advised to notify office if sx unimproved or worsen, present to ED for worsening unrelieved discomfort.  Understanding verbalized-jrion,rn

## 2011-10-15 ENCOUNTER — Encounter (HOSPITAL_COMMUNITY)
Admission: RE | Admit: 2011-10-15 | Discharge: 2011-10-15 | Disposition: A | Discharge status: Home / Self Care | Payer: Managed Care, Other (non HMO) | Source: Ambulatory Visit | Attending: Cardiovascular Disease | Admitting: Cardiovascular Disease

## 2011-10-17 ENCOUNTER — Encounter (HOSPITAL_COMMUNITY): Payer: Managed Care, Other (non HMO)

## 2011-10-20 ENCOUNTER — Encounter (HOSPITAL_COMMUNITY): Payer: Managed Care, Other (non HMO)

## 2011-10-20 ENCOUNTER — Encounter: Payer: Self-pay | Admitting: Physician Assistant

## 2011-10-20 ENCOUNTER — Ambulatory Visit: Payer: Managed Care, Other (non HMO) | Admitting: Physician Assistant

## 2011-10-20 ENCOUNTER — Ambulatory Visit (INDEPENDENT_AMBULATORY_CARE_PROVIDER_SITE_OTHER): Payer: Managed Care, Other (non HMO) | Admitting: Physician Assistant

## 2011-10-20 DIAGNOSIS — M79603 Pain in arm, unspecified: Secondary | ICD-10-CM

## 2011-10-20 DIAGNOSIS — M79609 Pain in unspecified limb: Secondary | ICD-10-CM

## 2011-10-20 DIAGNOSIS — I2510 Atherosclerotic heart disease of native coronary artery without angina pectoris: Secondary | ICD-10-CM

## 2011-10-20 DIAGNOSIS — I10 Essential (primary) hypertension: Secondary | ICD-10-CM

## 2011-10-20 MED ORDER — AMLODIPINE BESYLATE 2.5 MG PO TABS: 2.5000 mg | ORAL_TABLET | Freq: Every day | ORAL | Status: DC

## 2011-10-20 NOTE — Patient Instructions (Signed)
Your physician recommends that you schedule a follow-up appointment in: 11/06/11 @ 9:00 AM WITH DR. Eden Emms  Your physician has recommended you make the following change in your medication: START AMLODIPINE 2.5 MG 1 TABLET DAILY

## 2011-10-20 NOTE — Assessment & Plan Note (Signed)
Add amlodipine as noted.  Continue ASA and Plavix and statin.  Follow up with Dr. Charlton Haws on 12/13 as scheduled.

## 2011-10-20 NOTE — Assessment & Plan Note (Signed)
Controlled.  BP should be able to tolerate amlodipine 2.5 mg QD.

## 2011-10-20 NOTE — Progress Notes (Signed)
History of Present Illness: PCP:  Dr. Luz Brazen Primary Cardiologist:  Dr. Charlton Haws   Yesenia Evans is a 45 y.o. female who presents for follow up.  She was evaluated in July by Dr. Eden Emms for chest pain. Cardiac CT demonstrated high-grade circumflex lesion. Cardiac catheterization 7/27: D1 90%, proximal circumflex occluded with right-to-left collaterals, EF 60%. Her circumflex was felt to be a chronic occlusion and was treated medically. She received 3 overlapping drug-eluting stents to the first diagonal. Effient switched to Plavix due to nausea.   I saw her in 10/12 with left arm pain with exertion.  This was her initial symptom and it never went away after her PCI.  I put her on isosorbide as an antianginal.  She saw Dr. Charlton Haws back in follow up on 10/18.  Isosorbide made her feel bad and did not help the pain.  Isosorbide was stopped and Lisinopril was added for BP.  She was to see Dr. Charlton Haws back in follow up next month.  His last note indicates that re-look cath would be pursued if she had continued symptoms reproduced without UE exercise.  She is referred back by cardiac rehab due to continued symptoms.    She has been using the recumbent bike since last seen.  She can still reproduce her arm pain.  She notes that she yawns a lot.  No associated dyspnea, nausea or diaphoresis.  No syncope.  Pain resolves with rest.  She really cannot reproduce it with UE movements.  No neck pain.  She has not had to take NTG.  Pain typically starts several minutes after starting exercise.  Sometimes worse than others.    Past Medical History  Diagnosis Date  . Left arm pain   . CAD (coronary artery disease)     cath 05/2011: D1 90%, proximal circumflex occluded with right-to-left collaterals, EF 60%.  Her circumflex was felt to be a chronic occlusion and was treated medically.  She received 3 overlapping drug-eluting stents to the first diagonal  . Hypertension   . Hyperlipidemia   . Arthritis      Current Outpatient Prescriptions  Medication Sig Dispense Refill  . aspirin 81 MG tablet Take 81 mg by mouth daily.        . ATORVASTATIN CALCIUM PO Take 20 mg by mouth daily.        . clopidogrel (PLAVIX) 75 MG tablet Take 1 tablet (75 mg total) by mouth daily.  7 tablet  0  . lisinopril (PRINIVIL,ZESTRIL) 5 MG tablet Take 1 tablet (5 mg total) by mouth daily.  30 tablet  11  . metoprolol tartrate (LOPRESSOR) 25 MG tablet 1/2 TAB TWICE DAILY  7 tablet  0  . Multiple Vitamins-Minerals (MULTIVITAMIN WITH MINERALS) tablet Take 1 tablet by mouth daily. Half tablet in morning half tablet at night       . nitroGLYCERIN (NITROSTAT) 0.4 MG SL tablet Place 0.4 mg under the tongue as needed.        . pantoprazole (PROTONIX) 40 MG tablet Take 40 mg by mouth as needed.       . valACYclovir (VALTREX) 500 MG tablet Take 500 mg by mouth daily.          Allergies: Allergies  Allergen Reactions  . Adhesive (Tape)   . Ceclor (Cefaclor)     History  Substance Use Topics  . Smoking status: Never Smoker   . Smokeless tobacco: Not on file  . Alcohol Use: Yes  ROS:  Please see the history of present illness.   All other systems reviewed and negative.   Vital Signs: BP 136/72  Ht 5\' 7"  (1.702 m)  Wt 180 lb 12.8 oz (82.01 kg)  BMI 28.32 kg/m2  PHYSICAL EXAM: Well nourished, well developed, in no acute distress HEENT: normal Neck: no JVD MSK:  No cervical spine tenderness to palpitation Cardiac:  normal S1, S2; RRR; no murmur Lungs:  clear to auscultation bilaterally, no wheezing, rhonchi or rales Abd: soft, nontender, no hepatomegaly Ext: no edema Skin: warm and dry Neuro:  CNs 2-12 intact, no focal abnormalities noted Psych: normal affect  EKG:   NSR, HR 62, normal axis, NSSTTW changes no change from prior tracings.  ASSESSMENT AND PLAN:

## 2011-10-20 NOTE — Assessment & Plan Note (Signed)
I still suspect this is angina.  It is stable.  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  She did not tolerate isosorbide.  I have recommend proceeding with relook LHC today as outlined by Dr. Charlton Haws previously.  She is not eager to do this and would prefer to see Dr. Charlton Haws back in follow up first.  I will try her on amlodipine 2.5 mg QD as an antianginal to see if this helps.  Her response can be assessed at follow up 12/13 with Dr. Charlton Haws.  If she changes her mind before then, she can call and we can set up the LHC.

## 2011-10-22 ENCOUNTER — Encounter (HOSPITAL_COMMUNITY): Admission: RE | Admit: 2011-10-22 | Payer: Managed Care, Other (non HMO) | Source: Ambulatory Visit

## 2011-10-24 ENCOUNTER — Encounter (HOSPITAL_COMMUNITY)
Admission: RE | Admit: 2011-10-24 | Discharge: 2011-10-24 | Disposition: A | Discharge status: Home / Self Care | Payer: Managed Care, Other (non HMO) | Source: Ambulatory Visit | Attending: Cardiovascular Disease | Admitting: Cardiovascular Disease

## 2011-10-27 ENCOUNTER — Encounter (HOSPITAL_COMMUNITY)
Admission: RE | Admit: 2011-10-27 | Discharge: 2011-10-27 | Disposition: A | Discharge status: Home / Self Care | Payer: Managed Care, Other (non HMO) | Source: Ambulatory Visit | Attending: Cardiovascular Disease | Admitting: Cardiovascular Disease

## 2011-10-27 DIAGNOSIS — I2582 Chronic total occlusion of coronary artery: Secondary | ICD-10-CM | POA: Insufficient documentation

## 2011-10-27 DIAGNOSIS — Z5189 Encounter for other specified aftercare: Secondary | ICD-10-CM | POA: Insufficient documentation

## 2011-10-27 DIAGNOSIS — I498 Other specified cardiac arrhythmias: Secondary | ICD-10-CM | POA: Insufficient documentation

## 2011-10-27 DIAGNOSIS — E785 Hyperlipidemia, unspecified: Secondary | ICD-10-CM | POA: Insufficient documentation

## 2011-10-27 DIAGNOSIS — Z9861 Coronary angioplasty status: Secondary | ICD-10-CM | POA: Insufficient documentation

## 2011-10-27 DIAGNOSIS — I200 Unstable angina: Secondary | ICD-10-CM | POA: Insufficient documentation

## 2011-10-27 DIAGNOSIS — I10 Essential (primary) hypertension: Secondary | ICD-10-CM | POA: Insufficient documentation

## 2011-10-27 DIAGNOSIS — I2510 Atherosclerotic heart disease of native coronary artery without angina pectoris: Secondary | ICD-10-CM | POA: Insufficient documentation

## 2011-10-27 NOTE — Progress Notes (Addendum)
.                             Cardiac Rehabilitation Program Progress Report   Orientation:  07/10/2011 Graduate Date:  10/27/2011 Discharge Date:   # of sessions completed: 36  Cardiologist: Arrie Senate MD:  Rowan Blase Time:  0815  A.  Exercise Program:  Tolerates exercise @ 4.5 METS for 30 minutes, Bike Test Results:  Pre: .97 miles, Improved functional capacity  9.3 %, Decreased  muscular strength  2.4 %, Improved  flexibility 10.7 %, Improved education score 9.8% %, Exercise limited by musculoskeletal problems and Discharged to home exercise program.  Anticipated compliance:  good  B.  Mental Health:  Good mental attitude and Quality of Life (QOL)  improvements:  Overall  17.3 %, Health/Functioning 10.1 %, Socioeconomics 52.2 %, Psych/Spiritual 10.1 %, Family 5.1 %    C.  Education/Instruction/Skills  Accurately checks own pulse.  Rest:  86 minimum  Exercise:  142 age-predicted max  Home exercise given: 07/30/2011  D.  Nutrition/Weight Control/Body Composition:  Adherence to prescribed nutrition program: good , % Body Fat  36.1, Patient has lost 6.0 kg and Evidence of fat weight loss  *This section completed by Mickle Plumb, Andres Shad, RD, LDN, CDE  E.  Blood Lipids    No results found for this basename: CHOL     No results found for this basename: TRIG     No results found for this basename: HDL     No results found for this basename: CHOLHDL     No results found for this basename: LDLDIRECT      F.  Lifestyle Changes:  Making positive lifestyle changes  G.  Symptoms noted with exercise:  Musculoskeletal problems  Report Completed By:  Dayton Martes   Comments:  Pt successfully completed cardiac rehab program.  Attended 36/36 sessions.  Pt had excellent attendance to both exercise and education classes.   Telemetry-NSR.  VSS.  Pt continued to have persistent left arm and shoulder discomfort with exercise activities that involved arm movement, pt reports  increased sx on Monday after lifting her young children for extended periods of time throughout weekend.  Pt has been evaluated for this, unable to tolerate Imdur, currently added Norvasc.  Pt tried different pieces of exercise equipment with lesser sx on recumbent bike instead of treadmill. Pt has made positive lifestyle changes and plans to continue exercise program at home.  Thank you for the referral-jr,rn

## 2011-10-29 ENCOUNTER — Encounter (HOSPITAL_COMMUNITY): Payer: Managed Care, Other (non HMO)

## 2011-10-31 ENCOUNTER — Encounter (HOSPITAL_COMMUNITY): Payer: Managed Care, Other (non HMO)

## 2011-11-06 ENCOUNTER — Encounter: Payer: Self-pay | Admitting: Cardiovascular Disease

## 2011-11-06 ENCOUNTER — Ambulatory Visit (INDEPENDENT_AMBULATORY_CARE_PROVIDER_SITE_OTHER): Payer: Managed Care, Other (non HMO) | Admitting: Cardiovascular Disease

## 2011-11-06 VITALS — BP 116/83 | HR 65 | Ht 67.0 in | Wt 177.0 lb

## 2011-11-06 DIAGNOSIS — R079 Chest pain, unspecified: Secondary | ICD-10-CM

## 2011-11-06 DIAGNOSIS — I2510 Atherosclerotic heart disease of native coronary artery without angina pectoris: Secondary | ICD-10-CM

## 2011-11-06 DIAGNOSIS — I10 Essential (primary) hypertension: Secondary | ICD-10-CM

## 2011-11-06 DIAGNOSIS — E782 Mixed hyperlipidemia: Secondary | ICD-10-CM

## 2011-11-06 MED ORDER — AMLODIPINE BESYLATE 2.5 MG PO TABS: 2.5000 mg | ORAL_TABLET | Freq: Every day | ORAL | Status: DC

## 2011-11-06 MED ORDER — AMLODIPINE BESYLATE 2.5 MG PO TABS
2.5000 mg | ORAL_TABLET | Freq: Every day | ORAL | Status: DC
Start: 2011-11-06 — End: 2014-04-12

## 2011-11-06 MED ORDER — LISINOPRIL 5 MG PO TABS: 5.0000 mg | ORAL_TABLET | Freq: Every day | ORAL | Status: DC

## 2011-11-06 NOTE — Patient Instructions (Signed)
Your physician recommends that you return for lab work in: tomorrow (lipid)  Your physician wants you to follow-up in: March 2013.  You will receive a reminder letter in the mail two months in advance. If you don't receive a letter, please call our office to schedule the follow-up appointment.

## 2011-11-06 NOTE — Assessment & Plan Note (Signed)
Will check labs tomorrow.  ON statin  Target LDL is 70 or less

## 2011-11-06 NOTE — Assessment & Plan Note (Signed)
Atypical.  On good medical Rx  Continue medical Rx for now

## 2011-11-06 NOTE — Progress Notes (Signed)
45 yo with SSCP. CArdiac CT suggesting high grade lesion in circumflex. Cath 7/27 with collaterlized circ and significant lesion in D1. 7/31 had 3 overlapping stents to D1. Effient caused nausea so on Plavix. 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 Rehab not ideal as participants are all older but she completed  Easy bruising on Plavix. No dyspnea. Compliant with meds. BP under good control.   Saw [name] on 10/13/11  Had SSCP  Recommended cath but patient deferred to this visit.  Did not tolerate imdur.  Low dose amlodipine added She continues to want to wait on cath.  Pain is atypical and she indicates seeing a chiropractor for her neck pain that radiates to her arm  ROS: Denies fever, malais, weight loss, blurry vision, decreased visual acuity, cough, sputum, SOB, hemoptysis, pleuritic pain, palpitaitons, heartburn, abdominal pain, melena, lower extremity edema, claudication, or rash.  All other systems reviewed and negative  General: Affect appropriate Healthy:  appears stated age HEENT: normal Neck supple with no adenopathy JVP normal no bruits no thyromegaly Lungs clear with no wheezing and good diaphragmatic motion Heart:  S1/S2 no murmur,rub, gallop or click PMI normal Abdomen: benighn, BS positve, no tenderness, no AAA no bruit.  No HSM or HJR Distal pulses intact with no bruits No edema Neuro non-focal Skin warm and dry No muscular weakness   Current Outpatient Prescriptions  Medication Sig Dispense Refill  . amLODipine (NORVASC) 2.5 MG tablet Take 1 tablet (2.5 mg total) by mouth daily.  30 tablet  11  . aspirin 81 MG tablet Take 81 mg by mouth daily.        . ATORVASTATIN CALCIUM PO Take 20 mg by mouth daily.        . clopidogrel (PLAVIX) 75 MG tablet Take 1 tablet (75 mg total) by mouth daily.  7 tablet  0  . lisinopril (PRINIVIL,ZESTRIL) 5 MG tablet Take 1 tablet (5 mg total) by mouth daily.  30 tablet  11  . metoprolol tartrate (LOPRESSOR) 25 MG tablet 1/2 TAB  TWICE DAILY  7 tablet  0  . Multiple Vitamins-Minerals (MULTIVITAMIN WITH MINERALS) tablet Take 1 tablet by mouth daily. Half tablet in morning half tablet at night       . nitroGLYCERIN (NITROSTAT) 0.4 MG SL tablet Place 0.4 mg under the tongue as needed.        . pantoprazole (PROTONIX) 40 MG tablet Take 40 mg by mouth as needed.       . valACYclovir (VALTREX) 500 MG tablet Take 500 mg by mouth daily.          Allergies  Adhesive and Ceclor  Cannot tolerate imdur or Effient ( headache, migraine nausea)  Electrocardiogram:  Assessment and Plan

## 2011-11-06 NOTE — Assessment & Plan Note (Signed)
Well controlled.  Continue current medications and low sodium Dash type diet.    

## 2011-11-07 ENCOUNTER — Other Ambulatory Visit (INDEPENDENT_AMBULATORY_CARE_PROVIDER_SITE_OTHER): Payer: Managed Care, Other (non HMO) | Admitting: *Deleted

## 2011-11-07 DIAGNOSIS — E782 Mixed hyperlipidemia: Secondary | ICD-10-CM

## 2011-11-07 DIAGNOSIS — I2510 Atherosclerotic heart disease of native coronary artery without angina pectoris: Secondary | ICD-10-CM

## 2011-11-07 LAB — LIPID PANEL
Total CHOL/HDL Ratio: 3
VLDL: 16.6 mg/dL (ref 0.0–40.0)

## 2011-11-10 ENCOUNTER — Encounter: Payer: Self-pay | Admitting: *Deleted

## 2011-11-12 ENCOUNTER — Telehealth: Payer: Self-pay | Admitting: Cardiovascular Disease

## 2011-11-12 NOTE — Telephone Encounter (Signed)
New msg Pt had lab last week. She wants call back with results

## 2011-11-12 NOTE — Telephone Encounter (Signed)
Patient aware of lipids lab results.

## 2011-11-21 ENCOUNTER — Telehealth: Payer: Self-pay | Admitting: Cardiovascular Disease

## 2011-11-21 NOTE — Telephone Encounter (Signed)
New Msg: Pt calling wanting to discuss with MD about pt taking co-enzyme q 10. Pt was told to take this by another physician and pt first wants to get approval of taking this supplement from doctor. Please return pt call to discuss further.

## 2011-11-21 NOTE — Telephone Encounter (Signed)
Left message to call back  

## 2011-11-24 ENCOUNTER — Encounter: Payer: Self-pay | Admitting: Cardiovascular Disease

## 2011-11-24 NOTE — Telephone Encounter (Signed)
Follow- up: ° ° °Patient returned your phone call. Please call back. °

## 2011-11-24 NOTE — Telephone Encounter (Signed)
I spoke with the pt and made her aware that she can take Coenzyme Q10.  This med was recommended by a chiropractor.

## 2011-11-24 NOTE — Telephone Encounter (Signed)
This encounter was created in error - please disregard.

## 2012-01-20 ENCOUNTER — Telehealth: Payer: Self-pay | Admitting: Cardiovascular Disease

## 2012-01-20 NOTE — Telephone Encounter (Signed)
Pt was notified that she can take a plain antihistamine or plain mucinex or plain robitussin.  She was notified that she cannot take any decongestants.

## 2012-01-20 NOTE — Telephone Encounter (Signed)
New Problem   Please return call to patient on hm# [phone] regarding OTC Allergy meds.  Please leave a message on vm with recommendations if patient is unable to answer the phone.

## 2012-01-27 ENCOUNTER — Other Ambulatory Visit: Payer: Self-pay | Admitting: Cardiovascular Disease

## 2012-01-27 MED ORDER — ATORVASTATIN CALCIUM 20 MG PO TABS: 20.0000 mg | ORAL_TABLET | Freq: Every day | ORAL | Status: DC

## 2012-02-06 ENCOUNTER — Encounter: Payer: Self-pay | Admitting: Cardiovascular Disease

## 2012-02-06 ENCOUNTER — Ambulatory Visit (INDEPENDENT_AMBULATORY_CARE_PROVIDER_SITE_OTHER): Payer: Managed Care, Other (non HMO) | Admitting: Cardiovascular Disease

## 2012-02-06 VITALS — BP 110/72 | HR 70 | Wt 174.0 lb

## 2012-02-06 DIAGNOSIS — I2510 Atherosclerotic heart disease of native coronary artery without angina pectoris: Secondary | ICD-10-CM

## 2012-02-06 MED ORDER — ATENOLOL 25 MG PO TABS: 25.0000 mg | ORAL_TABLET | Freq: Every day | ORAL | Status: DC

## 2012-02-06 NOTE — Assessment & Plan Note (Signed)
Cholesterol is at goal.  Continue current dose of statin and diet Rx.  No myalgias or side effects.  F/U  LFT's in 6 months. Lab Results  Component Value Date   LDLCALC 63 11/07/2011             

## 2012-02-06 NOTE — Assessment & Plan Note (Signed)
Stable with no angina and good activity level.  Continue medical Rx Try atenolol instead of lopresser to see if alopecia improves

## 2012-02-06 NOTE — Progress Notes (Signed)
46 yo with SSCP. CArdiac CT suggesting high grade lesion in circumflex. Cath 7/27 with collaterlized circ and significant lesion in D1. 7/31 had 3 overlapping stents to D1. Effient caused nausea so on Plavix. 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 Rehab not ideal as participants are all older but she completed Easy bruising on Plavix. No dyspnea. Compliant with meds. BP under good control.  Saw [name] on 10/13/11 Had SSCP Recommended cath but patient deferred to this visit. Did not tolerate imdur. Low dose amlodipine added  She continues to want to wait on cath. Pain is atypical and she indicates seeing a chiropractor for her neck pain that radiates to her arm  Hair is falling out ? From meds.  Dry eyes told her ok to take fish oil  Two kids under age 63 hectic  ROS: Denies fever, malais, weight loss, blurry vision, decreased visual acuity, cough, sputum, SOB, hemoptysis, pleuritic pain, palpitaitons, heartburn, abdominal pain, melena, lower extremity edema, claudication, or rash.  All other systems reviewed and negative  General: Affect appropriate Healthy:  appears stated age HEENT: normal Neck supple with no adenopathy JVP normal no bruits no thyromegaly Lungs clear with no wheezing and good diaphragmatic motion Heart:  S1/S2 no murmur, no rub, gallop or click PMI normal Abdomen: benighn, BS positve, no tenderness, no AAA no bruit.  No HSM or HJR Distal pulses intact with no bruits No edema Neuro non-focal Skin warm and dry No muscular weakness   Current Outpatient Prescriptions  Medication Sig Dispense Refill  . amLODipine (NORVASC) 2.5 MG tablet Take 1 tablet (2.5 mg total) by mouth daily.  90 tablet  3  . aspirin 81 MG tablet Take 81 mg by mouth daily.        Marland Kitchen atorvastatin (LIPITOR) 20 MG tablet Take 1 tablet (20 mg total) by mouth daily.  90 tablet  3  . clopidogrel (PLAVIX) 75 MG tablet Take 1 tablet (75 mg total) by mouth daily.  7 tablet  0  . lisinopril  (PRINIVIL,ZESTRIL) 5 MG tablet Take 1 tablet (5 mg total) by mouth daily.  90 tablet  3  . metoprolol tartrate (LOPRESSOR) 25 MG tablet 1/2 TAB TWICE DAILY  7 tablet  0  . Multiple Vitamins-Minerals (MULTIVITAMIN WITH MINERALS) tablet Take 1 tablet by mouth daily. Half tablet in morning half tablet at night       . nitroGLYCERIN (NITROSTAT) 0.4 MG SL tablet Place 0.4 mg under the tongue as needed.        . pantoprazole (PROTONIX) 40 MG tablet Take 40 mg by mouth as needed.       . valACYclovir (VALTREX) 500 MG tablet Take 500 mg by mouth daily.          Allergies  Adhesive; Ceclor; Effient; and Imdur  Electrocardiogram:  Assessment and Plan

## 2012-02-06 NOTE — Assessment & Plan Note (Signed)
Well controlled.  Continue current medications and low sodium Dash type diet.    

## 2012-02-06 NOTE — Patient Instructions (Signed)
Your physician recommends that you schedule a follow-up appointment in: 3 MONTHS WITH DR [facility] Your physician has recommended you make the following change in your medication:  START ATENOLOL  25 MG EVERY DAY STOP  METOPROLOL

## 2012-03-29 ENCOUNTER — Other Ambulatory Visit: Payer: Self-pay

## 2012-03-29 MED ORDER — CLOPIDOGREL BISULFATE 75 MG PO TABS: 75.0000 mg | ORAL_TABLET | Freq: Every day | ORAL | Status: DC

## 2012-05-05 ENCOUNTER — Ambulatory Visit: Payer: Managed Care, Other (non HMO) | Admitting: Cardiovascular Disease

## 2012-05-24 ENCOUNTER — Encounter: Payer: Self-pay | Admitting: Cardiovascular Disease

## 2012-05-24 ENCOUNTER — Ambulatory Visit (INDEPENDENT_AMBULATORY_CARE_PROVIDER_SITE_OTHER): Payer: Managed Care, Other (non HMO) | Admitting: Cardiovascular Disease

## 2012-05-24 VITALS — BP 124/80 | HR 53 | Ht 67.0 in | Wt 175.0 lb

## 2012-05-24 DIAGNOSIS — I2510 Atherosclerotic heart disease of native coronary artery without angina pectoris: Secondary | ICD-10-CM

## 2012-05-24 DIAGNOSIS — E782 Mixed hyperlipidemia: Secondary | ICD-10-CM

## 2012-05-24 DIAGNOSIS — I209 Angina pectoris, unspecified: Secondary | ICD-10-CM

## 2012-05-24 NOTE — Assessment & Plan Note (Signed)
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  Symptoms hard to read.  F/U stress myovue

## 2012-05-24 NOTE — Patient Instructions (Addendum)
Your physician wants you to follow-up in:   6 MONTHS WITH DR Haywood Filler will receive a reminder letter in the mail two months in advance. If you don't receive a letter, please call our office to schedule the follow-up appointment. Your physician recommends that you continue on your current medications as directed. Please refer to the Current Medication list given to you today. Your physician has requested that you have en exercise stress myoview. For further information please visit [email or web address] Please follow instruction sheet, as given. DX CHEST PAIN  Your physician recommends that you return for lab work in: FASTING DAY  OF MYOVIEW LIPID LIVER DX 272.4  V58.69

## 2012-05-24 NOTE — Assessment & Plan Note (Signed)
Well controlled.  Continue current medications and low sodium Dash type diet.    

## 2012-05-24 NOTE — Progress Notes (Signed)
Patient ID: Yesenia Evans, female   DOB: Aug 29, 1966, 46 y.o.   MRN: [id number] 46 yo with SSCP. CArdiac CT suggesting high grade lesion in circumflex. Cath 7/27 with collaterlized circ and significant lesion in D1. 7/31 had 3 overlapping stents to D1. Effient caused nausea so on Plavix   Easy bruising on Plavix. No dyspnea. Compliant with meds. BP under good control.  Saw [name] on 10/13/11 Had SSCP Recommended cath but patient deferred to this visit. Did not tolerate imdur. Low dose amlodipine added   Still gets some chest ;pain radiating to neck with exertion.  No rest pain Took nitro once but tries to avoid it as it causes a bad headache  Last visit lopressor stopped and atenolol tried Hair loss improved.    Just moved to [facility]   ROS: Denies fever, malais, weight loss, blurry vision, decreased visual acuity, cough, sputum, SOB, hemoptysis, pleuritic pain, palpitaitons, heartburn, abdominal pain, melena, lower extremity edema, claudication, or rash.  All other systems reviewed and negative  General: Affect appropriate Healthy:  appears stated age HEENT: normal Neck supple with no adenopathy JVP normal no bruits no thyromegaly Lungs clear with no wheezing and good diaphragmatic motion Heart:  S1/S2 no murmur, no rub, gallop or click PMI normal Abdomen: benighn, BS positve, no tenderness, no AAA no bruit.  No HSM or HJR Distal pulses intact with no bruits No edema Neuro non-focal Skin warm and dry No muscular weakness   Current Outpatient Prescriptions  Medication Sig Dispense Refill  . amLODipine (NORVASC) 2.5 MG tablet Take 1 tablet (2.5 mg total) by mouth daily.  90 tablet  3  . aspirin 81 MG tablet Take 81 mg by mouth daily.        Marland Kitchen atenolol (TENORMIN) 25 MG tablet Take 1 tablet (25 mg total) by mouth daily.  14 tablet  1  . atorvastatin (LIPITOR) 20 MG tablet Take 1 tablet (20 mg total) by mouth daily.  90 tablet  3  . clopidogrel (PLAVIX) 75 MG tablet Take 1 tablet  (75 mg total) by mouth daily.  90 tablet  4  . lisinopril (PRINIVIL,ZESTRIL) 5 MG tablet Take 1 tablet (5 mg total) by mouth daily.  90 tablet  3  . Multiple Vitamins-Minerals (MULTIVITAMIN WITH MINERALS) tablet Take 1 tablet by mouth daily. Half tablet in morning half tablet at night       . nitroGLYCERIN (NITROSTAT) 0.4 MG SL tablet Place 0.4 mg under the tongue as needed.        . pantoprazole (PROTONIX) 40 MG tablet Take 40 mg by mouth as needed.       . valACYclovir (VALTREX) 500 MG tablet Take 500 mg by mouth daily.        Marland Kitchen DISCONTD: metoprolol tartrate (LOPRESSOR) 25 MG tablet 1/2 TAB TWICE DAILY  7 tablet  0    Allergies  Adhesive; Ceclor; Effient; and Imdur  Electrocardiogram:  Assessment and Plan

## 2012-05-24 NOTE — Assessment & Plan Note (Signed)
Cholesterol is at goal.  Continue current dose of statin and diet Rx.  No myalgias or side effects.  F/U  LFT's in 6 months. Lab Results  Component Value Date   LDLCALC 63 11/07/2011

## 2012-06-07 ENCOUNTER — Ambulatory Visit (INDEPENDENT_AMBULATORY_CARE_PROVIDER_SITE_OTHER): Payer: Managed Care, Other (non HMO) | Admitting: *Deleted

## 2012-06-07 ENCOUNTER — Ambulatory Visit (HOSPITAL_COMMUNITY): Payer: Managed Care, Other (non HMO) | Attending: Cardiovascular Disease | Admitting: Radiology

## 2012-06-07 VITALS — BP 137/78 | Ht 67.0 in | Wt 173.0 lb

## 2012-06-07 DIAGNOSIS — M25519 Pain in unspecified shoulder: Secondary | ICD-10-CM | POA: Insufficient documentation

## 2012-06-07 DIAGNOSIS — I209 Angina pectoris, unspecified: Secondary | ICD-10-CM

## 2012-06-07 DIAGNOSIS — I2510 Atherosclerotic heart disease of native coronary artery without angina pectoris: Secondary | ICD-10-CM

## 2012-06-07 DIAGNOSIS — R0602 Shortness of breath: Secondary | ICD-10-CM

## 2012-06-07 DIAGNOSIS — E782 Mixed hyperlipidemia: Secondary | ICD-10-CM

## 2012-06-07 DIAGNOSIS — E785 Hyperlipidemia, unspecified: Secondary | ICD-10-CM | POA: Insufficient documentation

## 2012-06-07 DIAGNOSIS — R079 Chest pain, unspecified: Secondary | ICD-10-CM

## 2012-06-07 DIAGNOSIS — I10 Essential (primary) hypertension: Secondary | ICD-10-CM

## 2012-06-07 DIAGNOSIS — R5381 Other malaise: Secondary | ICD-10-CM | POA: Insufficient documentation

## 2012-06-07 LAB — HEPATIC FUNCTION PANEL
ALT: 20 U/L (ref 0–35)
AST: 23 U/L (ref 0–37)
Bilirubin, Direct: 0.1 mg/dL (ref 0.0–0.3)
Total Bilirubin: 0.8 mg/dL (ref 0.3–1.2)

## 2012-06-07 LAB — LIPID PANEL
Cholesterol: 145 mg/dL (ref 0–200)
LDL Cholesterol: 83 mg/dL (ref 0–99)
Total CHOL/HDL Ratio: 3
Triglycerides: 69 mg/dL (ref 0.0–149.0)
VLDL: 13.8 mg/dL (ref 0.0–40.0)

## 2012-06-07 MED ORDER — TECHNETIUM TC 99M TETROFOSMIN IV KIT
30.0000 | PACK | Freq: Once | INTRAVENOUS | Status: AC | PRN
  Administered 2012-06-07: 30 via INTRAVENOUS

## 2012-06-07 MED ORDER — TECHNETIUM TC 99M TETROFOSMIN IV KIT
10.0000 | PACK | Freq: Once | INTRAVENOUS | Status: AC | PRN
  Administered 2012-06-07: 10 via INTRAVENOUS

## 2012-06-07 NOTE — Progress Notes (Signed)
[facility] SITE 3 NUCLEAR MED [address] [place] [address] [phone]  Cardiology Nuclear Med Study  Yesenia Evans is a 46 y.o. female     MRN : [id number]     DOB: 02-Dec-1965  Procedure Date: 06/07/2012  Nuclear Med Background Indication for Stress Test:  Evaluation for Ischemia and Stent Patency History: 06/13/11 CT (abn);05/27/11 Echo;06/20/11 Heart Catheterization;7/12 Stents;CAD Cardiac Risk Factors: Hypertension and Lipids  Symptoms:  Fatigue and arm/shoulder pain   Nuclear Pre-Procedure Caffeine/Decaff Intake:  None NPO After: 8:00pm   Lungs:  clear O2 Sat: 99% on room air. IV 0.9% NS with Angio Cath:  22g  IV Site: R Antecubital  IV Started by:  Irean Hong, RN  Chest Size (in):  38 Cup Size: D  Height: 5\' 7"  (1.702 m)  Weight:  173 lb (78.472 kg)  BMI:  Body mass index is 27.10 kg/(m^2). Tech Comments:  Atenolol held x 24 hrs    Nuclear Med Study 1 or 2 day study: 1 day  Stress Test Type:  Stress  Reading MD: Marca Ancona, MD  Order Authorizing Provider:  Burna Cash  Resting Radionuclide: Technetium 67m Tetrofosmin  Resting Radionuclide Dose: 11.0 mCi   Stress Radionuclide:  Technetium 25m Tetrofosmin  Stress Radionuclide Dose: 31.7 mCi           Stress Protocol Rest HR: 78 Stress HR: 169  Rest BP: 136/96 Stress BP: 156/91  Exercise Time (min): 10:81min METS: 12.10   Predicted Max HR: 176 bpm % Max HR: 96.02 bpm Rate Pressure Product: 28413   Dose of Adenosine (mg):  n/a Dose of Lexiscan: n/a mg  Dose of Atropine (mg): n/a Dose of Dobutamine: n/a mcg/kg/min (at max HR)  Stress Test Technologist: Frederick Peers, EMT-P  Nuclear Technologist:  Domenic Polite, CNMT     Rest Procedure:  Myocardial perfusion imaging was performed at rest 45 minutes following the intravenous administration of Technetium 51m Tetrofosmin. Rest ECG: NSR - Normal EKG  Stress Procedure:  The patient performed treadmill exercise using a Bruce   Protocol for  1013 minutes. The patient stopped due to SOB,leg fatigue and denied any chest pain.  There were no significant ST-T wave changes.  Technetium 40m Tetrofosmin was injected at peak exercise and myocardial perfusion imaging was performed after a brief delay. Stress ECG: Insignificant upsloping ST segment depression.  QPS Raw Data Images:  Normal; no motion artifact; normal heart/lung ratio. Stress Images:  Normal homogeneous uptake in all areas of the myocardium. Rest Images:  Normal homogeneous uptake in all areas of the myocardium. Subtraction (SDS):  There is no evidence of scar or ischemia. Transient Ischemic Dilatation (Normal <1.22):  0.82 Lung/Heart Ratio (Normal <0.45):  0.26  Quantitative Gated Spect Images QGS EDV:  59 ml QGS ESV:  19 ml  Impression Exercise Capacity:  Good exercise capacity. BP Response:  Normal blood pressure response. Clinical Symptoms:  Shoulder and arm pain.  ECG Impression:  Insignificant upsloping ST segment depression. Comparison with Prior Nuclear Study: No images to compare  Overall Impression:  Normal stress nuclear study.  LV Ejection Fraction: 68%.  LV Wall Motion:  NL LV Function; NL Wall Motion  Marca Ancona 06/07/2012

## 2012-11-08 ENCOUNTER — Ambulatory Visit (INDEPENDENT_AMBULATORY_CARE_PROVIDER_SITE_OTHER): Payer: Managed Care, Other (non HMO) | Admitting: Cardiovascular Disease

## 2012-11-08 VITALS — BP 130/90 | Ht 67.0 in | Wt 185.8 lb

## 2012-11-08 DIAGNOSIS — I2510 Atherosclerotic heart disease of native coronary artery without angina pectoris: Secondary | ICD-10-CM

## 2012-11-08 MED ORDER — LISINOPRIL 5 MG PO TABS: 5.0000 mg | ORAL_TABLET | Freq: Every day | ORAL | Status: DC

## 2012-11-08 MED ORDER — NITROGLYCERIN 0.4 MG SL SUBL: 0.4000 mg | SUBLINGUAL_TABLET | SUBLINGUAL | Status: DC | PRN

## 2012-11-08 MED ORDER — ATORVASTATIN CALCIUM 20 MG PO TABS: 20.0000 mg | ORAL_TABLET | Freq: Every day | ORAL | Status: DC

## 2012-11-08 MED ORDER — AMLODIPINE BESYLATE 2.5 MG PO TABS: 2.5000 mg | ORAL_TABLET | Freq: Every day | ORAL | Status: DC

## 2012-11-08 MED ORDER — ATENOLOL 25 MG PO TABS: 25.0000 mg | ORAL_TABLET | Freq: Every day | ORAL | Status: DC

## 2012-11-08 MED ORDER — CLOPIDOGREL BISULFATE 75 MG PO TABS: 75.0000 mg | ORAL_TABLET | Freq: Every day | ORAL | Status: DC

## 2012-11-08 NOTE — Assessment & Plan Note (Signed)
Well controlled.  Continue current medications and low sodium Dash type diet.    

## 2012-11-08 NOTE — Patient Instructions (Signed)
Your physician wants you to follow-up in:  6 MONTHS WITH DR NISHAN  You will receive a reminder letter in the mail two months in advance. If you don't receive a letter, please call our office to schedule the follow-up appointment. Your physician recommends that you continue on your current medications as directed. Please refer to the Current Medication list given to you today. 

## 2012-11-08 NOTE — Assessment & Plan Note (Signed)
Stable with no angina and good activity level.  Continue medical Rx  

## 2012-11-08 NOTE — Progress Notes (Signed)
Patient ID: Yesenia Evans, female   DOB: Nov 04, 1966, 46 y.o.   MRN: [id number] 46 yo with SSCP. CArdiac CT suggesting high grade lesion in circumflex. Cath 7/27 with collaterlized circ and significant lesion in D1. 7/31 had 3 overlapping stents to D1. Effient caused nausea so on Plavix. 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 Rehab not ideal as participants are all older but she completed Easy bruising on Plavix. No dyspnea. Compliant with meds. BP under good control.  Saw [name] on 10/13/11 Had SSCP Recommended cath but patient deferred to this visit. Did not tolerate imdur. Low dose amlodipine added  She continues to want to wait on cath. Pain is atypical and she indicates seeing a chiropractor for her neck pain that radiates to her arm   Hair is falling out ? From meds. Dry eyes told her ok to take fish oil Two kids under age 39 hectic  Filled out health form for work  ROS: Denies fever, malais, weight loss, blurry vision, decreased visual acuity, cough, sputum, SOB, hemoptysis, pleuritic pain, palpitaitons, heartburn, abdominal pain, melena, lower extremity edema, claudication, or rash.  All other systems reviewed and negative  General: Affect appropriate Healthy:  appears stated age HEENT: normal Neck supple with no adenopathy JVP normal no bruits no thyromegaly Lungs clear with no wheezing and good diaphragmatic motion Heart:  S1/S2 no murmur, no rub, gallop or click PMI normal Abdomen: benighn, BS positve, no tenderness, no AAA no bruit.  No HSM or HJR Distal pulses intact with no bruits No edema Neuro non-focal Skin warm and dry No muscular weakness   Current Outpatient Prescriptions  Medication Sig Dispense Refill  . amLODipine (NORVASC) 2.5 MG tablet Take 1 tablet (2.5 mg total) by mouth daily.  90 tablet  3  . aspirin 81 MG tablet Take 81 mg by mouth daily.        Marland Kitchen atenolol (TENORMIN) 25 MG tablet Take 1 tablet (25 mg total) by mouth daily.  14 tablet  1  .  atorvastatin (LIPITOR) 20 MG tablet Take 1 tablet (20 mg total) by mouth daily.  90 tablet  3  . clopidogrel (PLAVIX) 75 MG tablet Take 1 tablet (75 mg total) by mouth daily.  90 tablet  4  . lisinopril (PRINIVIL,ZESTRIL) 5 MG tablet Take 1 tablet (5 mg total) by mouth daily.  90 tablet  3  . Multiple Vitamins-Minerals (MULTIVITAMIN WITH MINERALS) tablet Take 1 tablet by mouth daily. Half tablet in morning half tablet at night       . nitroGLYCERIN (NITROSTAT) 0.4 MG SL tablet Place 0.4 mg under the tongue as needed.        . pantoprazole (PROTONIX) 40 MG tablet Take 40 mg by mouth as needed.       . valACYclovir (VALTREX) 500 MG tablet Take 500 mg by mouth daily.        . [DISCONTINUED] metoprolol tartrate (LOPRESSOR) 25 MG tablet 1/2 TAB TWICE DAILY  7 tablet  0    Allergies  Adhesive; Ceclor; Effient; and Imdur  Electrocardiogram:  10/20/11  SR rate 62 normal  Assessment and Plan

## 2012-11-08 NOTE — Assessment & Plan Note (Signed)
Cholesterol is at goal.  Continue current dose of statin and diet Rx.  No myalgias or side effects.  F/U  LFT's in 6 months. Lab Results  Component Value Date   LDLCALC 83 06/07/2012

## 2012-11-09 ENCOUNTER — Telehealth: Payer: Self-pay | Admitting: Cardiovascular Disease

## 2012-11-09 MED ORDER — AMLODIPINE BESYLATE 2.5 MG PO TABS: 2.5000 mg | ORAL_TABLET | Freq: Every day | ORAL | Status: DC

## 2012-11-09 MED ORDER — LISINOPRIL 5 MG PO TABS: 5.0000 mg | ORAL_TABLET | Freq: Every day | ORAL | Status: DC

## 2012-11-09 NOTE — Telephone Encounter (Signed)
Pt checking to see if these meds were called in, lisinopril and amlodipine , when in last all meds were to be refilled and looking at the website with [facility] , these two meds were not on the list for refills, can these two be called in?

## 2013-01-24 ENCOUNTER — Telehealth: Payer: Self-pay | Admitting: Cardiovascular Disease

## 2013-01-24 NOTE — Telephone Encounter (Signed)
New problem    Pt was wonder if any of her medications causes dry eye. She takes Atorvastatin, Clopidogrel, Atenolol, Lisinopril and Amlodipine. Pt would like to speak to a nurse about this matter. If you get vm pt stated you can leave a message.

## 2013-01-24 NOTE — Telephone Encounter (Signed)
PT AWARE TO CALL PHARMACISTS  AND ASK IF LISTED MEDS WOULD  CAUSE DRY EYE  PT VERBALIZED UNDERSTANDING .Zack Seal

## 2013-05-13 ENCOUNTER — Encounter: Payer: Self-pay | Admitting: Cardiovascular Disease

## 2013-05-13 ENCOUNTER — Ambulatory Visit (INDEPENDENT_AMBULATORY_CARE_PROVIDER_SITE_OTHER): Payer: Managed Care, Other (non HMO) | Admitting: Cardiovascular Disease

## 2013-05-13 VITALS — BP 133/89 | HR 53 | Ht 67.0 in | Wt 186.0 lb

## 2013-05-13 DIAGNOSIS — E782 Mixed hyperlipidemia: Secondary | ICD-10-CM

## 2013-05-13 DIAGNOSIS — I2510 Atherosclerotic heart disease of native coronary artery without angina pectoris: Secondary | ICD-10-CM

## 2013-05-13 DIAGNOSIS — I10 Essential (primary) hypertension: Secondary | ICD-10-CM

## 2013-05-13 MED ORDER — LISINOPRIL 10 MG PO TABS: 10.0000 mg | ORAL_TABLET | Freq: Every day | ORAL | Status: DC

## 2013-05-13 NOTE — Assessment & Plan Note (Signed)
Stable with no angina and good activity level.  Continue medical Rx  

## 2013-05-13 NOTE — Progress Notes (Signed)
Patient ID: Yesenia Evans, female   DOB: [birth year], 47 y.o.   MRN: [id number] 47 yo with SSCP. CArdiac CT suggesting high grade lesion in circumflex. Cath 7/27 with collaterlized circ and significant lesion in D1. 7/31 had 3 overlapping stents to D1. Effient caused nausea so on Plavix. 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 Rehab not ideal as participants are all older but she completed Easy bruising on Plavix. No dyspnea. Compliant with meds. BP under good control.  Saw [name] on 10/13/11 Had SSCP Recommended cath but patient deferred to this visit. Did not tolerate imdur. Low dose amlodipine added  She continues to want to wait on cath. Pain is atypical and she indicates seeing a chiropractor for her neck pain that radiates to her arm  Hair is falling out ? From meds. Dry eyes told her ok to take fish oil Two kids under age 28 hectic    ROS: Denies fever, malais, weight loss, blurry vision, decreased visual acuity, cough, sputum, SOB, hemoptysis, pleuritic pain, palpitaitons, heartburn, abdominal pain, melena, lower extremity edema, claudication, or rash.  All other systems reviewed and negative  General: Affect appropriate Healthy:  appears stated age HEENT: normal Neck supple with no adenopathy JVP normal no bruits no thyromegaly Lungs clear with no wheezing and good diaphragmatic motion Heart:  S1/S2 no murmur, no rub, gallop or click PMI normal Abdomen: benighn, BS positve, no tenderness, no AAA no bruit.  No HSM or HJR Distal pulses intact with no bruits No edema Neuro non-focal Skin warm and dry No muscular weakness   Current Outpatient Prescriptions  Medication Sig Dispense Refill  . amLODipine (NORVASC) 2.5 MG tablet Take 1 tablet (2.5 mg total) by mouth daily.  90 tablet  4  . aspirin 81 MG tablet Take 81 mg by mouth daily.        Marland Kitchen atenolol (TENORMIN) 25 MG tablet Take 1 tablet (25 mg total) by mouth daily.  90 tablet  4  . atorvastatin (LIPITOR) 20 MG tablet Take  1 tablet (20 mg total) by mouth daily.  90 tablet  4  . clopidogrel (PLAVIX) 75 MG tablet Take 1 tablet (75 mg total) by mouth daily.  90 tablet  4  . co-enzyme Q-10 30 MG capsule Take 300 mg by mouth 3 (three) times daily.      Marland Kitchen lisinopril (PRINIVIL,ZESTRIL) 5 MG tablet Take 1 tablet (5 mg total) by mouth daily.  90 tablet  4  . Multiple Vitamins-Minerals (MULTIVITAMIN WITH MINERALS) tablet Take 1 tablet by mouth daily. Half tablet in morning half tablet at night       . nitroGLYCERIN (NITROSTAT) 0.4 MG SL tablet Place 1 tablet (0.4 mg total) under the tongue as needed.  25 tablet  4  . pantoprazole (PROTONIX) 40 MG tablet Take 40 mg by mouth as needed.       . valACYclovir (VALTREX) 500 MG tablet Take 500 mg by mouth daily.        Marland Kitchen amLODipine (NORVASC) 2.5 MG tablet Take 1 tablet (2.5 mg total) by mouth daily.  90 tablet  3  . lisinopril (PRINIVIL,ZESTRIL) 5 MG tablet Take 1 tablet (5 mg total) by mouth daily.  90 tablet  3  . [DISCONTINUED] metoprolol tartrate (LOPRESSOR) 25 MG tablet 1/2 TAB TWICE DAILY  7 tablet  0   No current facility-administered medications for this visit.    Allergies  Adhesive; Ceclor; Effient; and Imdur  Electrocardiogram:  Assessment and Plan

## 2013-05-13 NOTE — Patient Instructions (Addendum)
Your physician wants you to follow-up in: 6 MONTHS WITH DR Haywood Filler will receive a reminder letter in the mail two months in advance. If you don't receive a letter, please call our office to schedule the follow-up appointment. Your physician has recommended you make the following change in your medication: INCREASE  LISINOPRIL  TO 10 MG   EVERY DAY STOP  AMLODIPINE

## 2013-05-13 NOTE — Assessment & Plan Note (Signed)
Stop amlodipine and double lisinopril to simplify regiman She will try to take BP at home

## 2013-05-13 NOTE — Assessment & Plan Note (Signed)
Cholesterol is at goal.  Continue current dose of statin and diet Rx.  No myalgias or side effects.  F/U  LFT's in 6 months. Lab Results  Component Value Date   LDLCALC 83 06/07/2012

## 2013-05-18 IMAGING — CT CT HEART MORP W/ CTA COR W/ SCORE W/ CA W/CM &/OR W/O CM
1 of 13 series · 3 of 20 positions shown, 4 images · IV contrast (CONTRAST)
Comparison: None.

***ADDENDUM*** CREATED: 06/13/2011 [DATE]

OVER-READ INTERPRETATION - CT CHEST
The following report is an over-read performed by radiologist Dr.
[DATE].  This over-read does not include interpretation of
cardiac or coronary anatomy or pathology.  The CTA interpretation
by the cardiologist is attached.
INDICATION: Chest Pain
PROTOCOL: The patient was scanned on a Philips 256 scanner.
Gantry rotation speed was 300 msec and collimation .9mm.  A K22k1
prospective scan was done with 5% phase tolerance centered around
78% of the R-R cycle.  The scan was triggered in the descending
thoracic aorta at 111 HU.  The patient received 10mg of iv
lopresser and sl nitro.  80cc of contrast was given.  iDose was
used at a level of 3 and mA reduced to 300 to save dose.  The 3D
data set was analyzed on a Philips work station using MIP,VRT and
MPR modes

[Series 14: w/o edge corr., 83.0% · axial · non-contrast · 0.49mm/px · z∈[-188,-64]mm · 3 of 276 slices shown, 4 images]
[im 1/276  vessel]
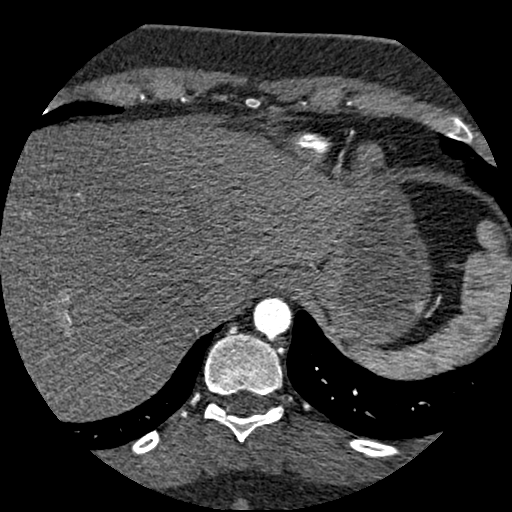
[im 1/276  lung]
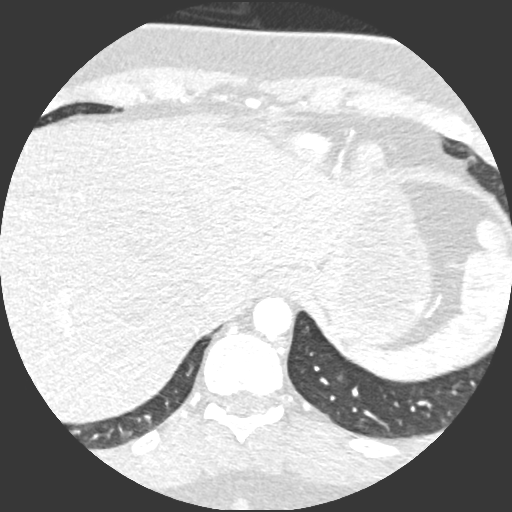
[im 138/276  vessel]
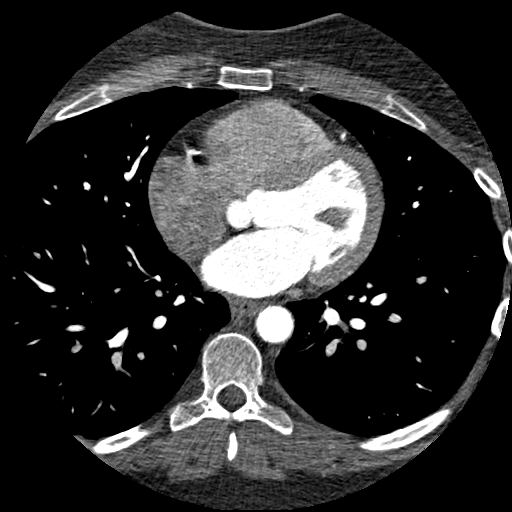
[im 276/276  vessel]
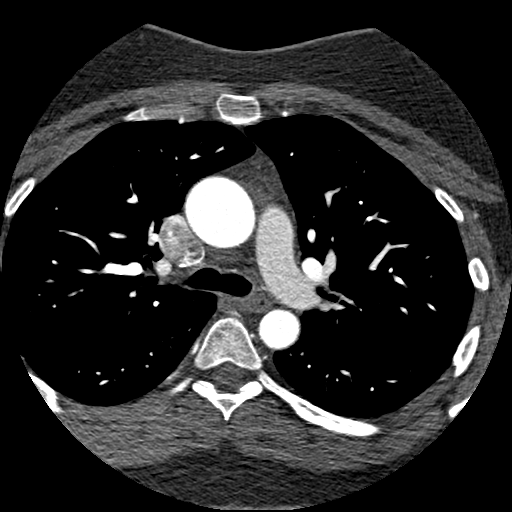

[3 of 20 positions shown; findings below may reference images not displayed]

FINDINGS: The visualized lung parenchyma appears normal.  No
evidence of pulmonary emboli.  There are no effusions.  No osseous
abnormalities.

The visualized portion of the upper abdomen is normal.
IMPRESSION: Normal exam.

Cardiac CT
Calcium Score: 397 Calcium score was 100% for age and sex matched
controls (woman age 40-44) Calcium in proximal LAD, D1, proximal,
mid and distal circumflex and proximal and mid circumflex

NonCardiac Findings:  No significant findings on lung and soft

Coronary Arteries: Right dominant with no anomalies.  LM- small
caliber artery with no focal stenosis.  LAD- less than 30% calcific
disease in proximal and mid vessel.  D1- less than 30% calcific
disease proximally.  Circumflex- possible high grade mixed plaque
in proximal to mid circumflex.  OM1- less than 30% calcific
disease. RCA- large and dominant Less than 30% calcific disease in
the proximal mid and distal vessel.
IMPRESSION: Very high calcium score of 397 for age and sex matched
controls.  Likely significant stenosis with mixed plaque in the
proximal circumflex artery.
Arrangement for heart cath have been made

## 2013-05-20 ENCOUNTER — Telehealth: Payer: Self-pay | Admitting: Cardiovascular Disease

## 2013-05-20 NOTE — Telephone Encounter (Signed)
New problem   Pt wants to know where and how often she needs to have blood work-pt also wants to know since she's moved and is now closer to [place] location how would dr Eden Emms like to proceed with her changing locations/physicians

## 2013-05-20 NOTE — Telephone Encounter (Signed)
Spoke with pt and told her per Dr.Nishan's last office note she is due for LFT's in 6 months.  She would also like to know if OK with Dr. Eden Emms for her to transfer to [facility] location as this is closer to her home in [place]. Pt aware this would be with Dr.Crenshaw as Dr. Eden Emms does not go to [place].

## 2013-05-23 NOTE — Telephone Encounter (Signed)
LAB ORDERED  MAILED TO  PT .Yesenia Evans

## 2013-05-23 NOTE — Telephone Encounter (Signed)
RECALL ENTERED  FOR PT TO FOLLOW UP WITH DR Jens Som  IN [place]   IS DUE  TO BE SEEN IN December ./CY

## 2013-05-23 NOTE — Telephone Encounter (Signed)
Ok to get fasting lipid and liver and have her see BC in [place]

## 2013-09-07 ENCOUNTER — Telehealth: Payer: Self-pay | Admitting: Cardiology

## 2013-09-07 NOTE — Telephone Encounter (Signed)
Spoke with pt, she will wait to see dr Jens Som piror to having labs done so if he wants something different she will have to be stuck again. Pt agreed with this plan.

## 2013-09-07 NOTE — Telephone Encounter (Signed)
Pt called because she has a prescription for fasting lipid/liver panel; given to pt in June by Dr Eden Emms. Pt states she has an appointment with Dr. Jens Som as a new pt in [place] on 09/21/13. Pt would like to know if she can go to [place] to have  labs prior the appointment, and if CBC w/diff can be added to those labs, because she feels weak and she thinks she is anemic. Pt is aware that this message will be send to MD's  nurse for reviewing.

## 2013-09-07 NOTE — Telephone Encounter (Signed)
New message   Pt has a presc for fasting lipid/liver written in June by Dr Juliann Pares.  Can she have labs drawn here or in [place] office

## 2013-09-21 ENCOUNTER — Encounter: Payer: Self-pay | Admitting: Cardiology

## 2013-09-21 ENCOUNTER — Ambulatory Visit (INDEPENDENT_AMBULATORY_CARE_PROVIDER_SITE_OTHER): Payer: Managed Care, Other (non HMO) | Admitting: Cardiology

## 2013-09-21 VITALS — BP 124/84 | HR 56 | Ht 67.0 in | Wt 188.0 lb

## 2013-09-21 DIAGNOSIS — I2510 Atherosclerotic heart disease of native coronary artery without angina pectoris: Secondary | ICD-10-CM

## 2013-09-21 MED ORDER — ATORVASTATIN CALCIUM 80 MG PO TABS: 80.0000 mg | ORAL_TABLET | Freq: Every day | ORAL | Status: AC

## 2013-09-21 NOTE — Progress Notes (Signed)
HPI: 47 yo female PVC followed by Dr. Eden Emms for fu of CAD. Previously seen with SSCP. Cardiac CT suggested high grade lesion in circumflex. Cath 7/12 with collaterlized circ and significant lesion in D1. EF normal. Patient had 3 overlapping stents to D1. Effient caused nausea. Nuclear study in July of 2013 showed an ejection fraction of 68% and normal perfusion. In reviewing notes patient has had intermittent atypical chest pain since her procedure. Cath has been recommended but she has postponed. Patient has mild dyspnea on exertion but no orthopnea, PND, pedal edema or syncope. She continues to have some pain in her left neck and arm with more vigorous activities. It is relieved with rest. This was present prior to her intervention and is unchanged since that time. She has some fatigue.   Current Outpatient Prescriptions  Medication Sig Dispense Refill  . aspirin 81 MG tablet Take 81 mg by mouth daily.        Marland Kitchen atenolol (TENORMIN) 25 MG tablet Take 1 tablet (25 mg total) by mouth daily.  90 tablet  4  . atorvastatin (LIPITOR) 20 MG tablet Take 1 tablet (20 mg total) by mouth daily.  90 tablet  4  . clopidogrel (PLAVIX) 75 MG tablet Take 1 tablet (75 mg total) by mouth daily.  90 tablet  4  . co-enzyme Q-10 30 MG capsule Take 300 mg by mouth daily.       Marland Kitchen lisinopril (PRINIVIL,ZESTRIL) 10 MG tablet Take 1 tablet (10 mg total) by mouth daily.  90 tablet  4  . nitroGLYCERIN (NITROSTAT) 0.4 MG SL tablet Place 1 tablet (0.4 mg total) under the tongue as needed.  25 tablet  4  . valACYclovir (VALTREX) 500 MG tablet Take 500 mg by mouth daily.        Marland Kitchen amLODipine (NORVASC) 2.5 MG tablet Take 1 tablet (2.5 mg total) by mouth daily.  90 tablet  3  . [DISCONTINUED] metoprolol tartrate (LOPRESSOR) 25 MG tablet 1/2 TAB TWICE DAILY  7 tablet  0   No current facility-administered medications for this visit.     Past Medical History  Diagnosis Date  . Left arm pain   . CAD (coronary artery  disease)     cath 05/2011: D1 90%, proximal circumflex occluded with right-to-left collaterals, EF 60%.  Her circumflex was felt to be a chronic occlusion and was treated medically.  She received 3 overlapping drug-eluting stents to the first diagonal  . Hypertension   . Hyperlipidemia   . Arthritis     Past Surgical History  Procedure Laterality Date  . Cesarean section      History   Social History  . Marital Status: Married    Spouse Name: N/A    Number of Children: N/A  . Years of Education: N/A   Occupational History  . Not on file.   Social History Main Topics  . Smoking status: Never Smoker   . Smokeless tobacco: Not on file  . Alcohol Use: Yes  . Drug Use: No  . Sexual Activity:    Other Topics Concern  . Not on file   Social History Narrative  . No narrative on file    ROS: no fevers or chills, productive cough, hemoptysis, dysphasia, odynophagia, melena, hematochezia, dysuria, hematuria, rash, seizure activity, orthopnea, PND, pedal edema, claudication. Remaining systems are negative.  Physical Exam: Well-developed well-nourished in no acute distress.  Skin is warm and dry.  HEENT is normal.  Neck is  supple.  Chest is clear to auscultation with normal expansion.  Cardiovascular exam is regular rate and rhythm.  Abdominal exam nontender or distended. No masses palpated. Extremities show no edema. neuro grossly intact  ECG sinus bradycardia with no ST changes.

## 2013-09-21 NOTE — Assessment & Plan Note (Signed)
It has been over 2 years since her previous stents. Discontinue Plavix. Continue aspirin. Continue statin. She has what sounds to be stable exertional angina most likely related to her occluded circumflex with collaterals. This is unchanged in severity and does not appear to be symptom limiting.

## 2013-09-21 NOTE — Patient Instructions (Signed)
Your physician wants you to follow-up in: 6 MONTHS WITH DR Jens Som You will receive a reminder letter in the mail two months in advance. If you don't receive a letter, please call our office to schedule the follow-up appointment.   STOP PLAVIX  INCREASE LIPITOR TO 80 MG ONCE DAILY  Your physician recommends that you return for lab work in: 6 WEEKS IN [place]- DO NOT EAT PRIOR TO LAB WORK

## 2013-09-21 NOTE — Assessment & Plan Note (Signed)
Given history of coronary disease I will increase her Lipitor to 80 mg daily. Check lipids and liver in 6 weeks.

## 2013-09-21 NOTE — Assessment & Plan Note (Signed)
Blood pressure controlled. Continue present medications. 

## 2013-11-08 LAB — HEPATIC FUNCTION PANEL
AST: 13 U/L (ref 0–37)
Albumin: 4.2 g/dL (ref 3.5–5.2)
Alkaline Phosphatase: 65 U/L (ref 39–117)
Bilirubin, Direct: 0.2 mg/dL (ref 0.0–0.3)
Indirect Bilirubin: 0.4 mg/dL (ref 0.0–0.9)
Total Bilirubin: 0.6 mg/dL (ref 0.3–1.2)

## 2013-11-08 LAB — LIPID PANEL
HDL: 45 mg/dL (ref >39)
LDL Cholesterol: 51 mg/dL (ref 0–99)

## 2013-11-13 ENCOUNTER — Other Ambulatory Visit: Payer: Self-pay | Admitting: Cardiovascular Disease

## 2013-11-15 ENCOUNTER — Other Ambulatory Visit: Payer: Self-pay | Admitting: Cardiovascular Disease

## 2014-03-31 ENCOUNTER — Telehealth: Payer: Self-pay | Admitting: Cardiology

## 2014-03-31 DIAGNOSIS — I10 Essential (primary) hypertension: Secondary | ICD-10-CM

## 2014-03-31 DIAGNOSIS — E7800 Pure hypercholesterolemia, unspecified: Secondary | ICD-10-CM

## 2014-03-31 NOTE — Telephone Encounter (Signed)
Spoke with pt, she reports we follow her cholesterol. Will mail lab orders to the pt so she can have checked prior to her appt.

## 2014-03-31 NOTE — Telephone Encounter (Signed)
New message          Pt has a 6 mo f/u in [place] should pt come in to have blood work done before the appt? If so can she just walk in?

## 2014-04-10 LAB — BASIC METABOLIC PANEL WITH GFR
BUN: 14 mg/dL (ref 6–23)
CALCIUM: 9.7 mg/dL (ref 8.4–10.5)
CO2: 29 mEq/L (ref 19–32)
Chloride: 103 mEq/L (ref 96–112)
Creat: 0.99 mg/dL (ref 0.50–1.10)
GFR, EST AFRICAN AMERICAN: 79 mL/min
GFR, Est Non African American: 69 mL/min
GLUCOSE: 85 mg/dL (ref 70–99)
POTASSIUM: 4.7 meq/L (ref 3.5–5.3)
Sodium: 139 mEq/L (ref 135–145)

## 2014-04-10 LAB — HEPATIC FUNCTION PANEL
ALT: 10 U/L (ref 0–35)
AST: 15 U/L (ref 0–37)
Albumin: 4.5 g/dL (ref 3.5–5.2)
Alkaline Phosphatase: 75 U/L (ref 39–117)
BILIRUBIN DIRECT: 0.2 mg/dL (ref 0.0–0.3)
Indirect Bilirubin: 0.7 mg/dL (ref 0.2–1.2)
Total Bilirubin: 0.9 mg/dL (ref 0.2–1.2)
Total Protein: 6.4 g/dL (ref 6.0–8.3)

## 2014-04-10 LAB — LIPID PANEL
CHOLESTEROL: 123 mg/dL (ref 0–200)
HDL: 51 mg/dL (ref >39)
LDL Cholesterol: 51 mg/dL (ref 0–99)
Total CHOL/HDL Ratio: 2.4 Ratio
Triglycerides: 106 mg/dL (ref <150)
VLDL: 21 mg/dL (ref 0–40)

## 2014-04-12 ENCOUNTER — Encounter: Payer: Self-pay | Admitting: Cardiology

## 2014-04-12 ENCOUNTER — Ambulatory Visit (INDEPENDENT_AMBULATORY_CARE_PROVIDER_SITE_OTHER): Payer: Managed Care, Other (non HMO) | Admitting: Cardiology

## 2014-04-12 VITALS — BP 134/80 | HR 57 | Wt 187.0 lb

## 2014-04-12 DIAGNOSIS — I10 Essential (primary) hypertension: Secondary | ICD-10-CM

## 2014-04-12 DIAGNOSIS — I2510 Atherosclerotic heart disease of native coronary artery without angina pectoris: Secondary | ICD-10-CM

## 2014-04-12 DIAGNOSIS — E782 Mixed hyperlipidemia: Secondary | ICD-10-CM

## 2014-04-12 MED ORDER — AMLODIPINE BESYLATE 5 MG PO TABS: 5.0000 mg | ORAL_TABLET | Freq: Every day | ORAL | Status: DC

## 2014-04-12 NOTE — Assessment & Plan Note (Signed)
I am adding Norvasc 5 mg daily to see if this helps with improved exercise capacity without angina. I will therefore discontinue lisinopril.

## 2014-04-12 NOTE — Assessment & Plan Note (Signed)
Continue statin. 

## 2014-04-12 NOTE — Assessment & Plan Note (Signed)
Continue aspirin. Continue statin. She has what sounds to be stable exertional angina most likely related to her occluded circumflex with collaterals. This is unchanged in severity. She would like to exercise harder but typically decreases her intensity when she feels her left arm discomfort which is her angina. I will add Norvasc 5 mg daily as an antianginal to see if this improves her symptoms.

## 2014-04-12 NOTE — Patient Instructions (Signed)
Your physician wants you to follow-up in: 6 MONTHS WITH DR Jens SomRENSHAW You will receive a reminder letter in the mail two months in advance. If you don't receive a letter, please call our office to schedule the follow-up appointment.   STOP LISINOPRIL  START AMLODIPINE 5 MG ONCE DAILY

## 2014-04-12 NOTE — Progress Notes (Signed)
      HPI: FU CAD. Previously seen with [facility]. Cardiac CT suggested high grade lesion in circumflex. Cath 7/12 with collaterlized circ and significant lesion in D1. EF normal. Patient had 3 overlapping stents to D1. Effient caused nausea. Nuclear study in July of 2013 showed an ejection fraction of 68% and normal perfusion. Patient last seen in Oct 2014. Since then, She has dyspnea and left arm discomfort with extreme activities relieved with rest. This is unchanged. She does not have these symptoms with routine activities.  Current Outpatient Prescriptions  Medication Sig Dispense Refill  . aspirin 81 MG tablet Take 81 mg by mouth daily.        Marland Kitchen. atenolol (TENORMIN) 25 MG tablet TAKE 1 TABLET DAILY  90 tablet  1  . atorvastatin (LIPITOR) 80 MG tablet Take 1 tablet (80 mg total) by mouth daily.  90 tablet  4  . lisinopril (PRINIVIL,ZESTRIL) 10 MG tablet Take 1 tablet (10 mg total) by mouth daily.  90 tablet  4  . NITROSTAT 0.4 MG SL tablet DISSOLVE 1 TABLET UNDER THETONGUE AS NEEDED  25 tablet  4  . valACYclovir (VALTREX) 500 MG tablet Take 500 mg by mouth daily.        . [DISCONTINUED] metoprolol tartrate (LOPRESSOR) 25 MG tablet 1/2 TAB TWICE DAILY  7 tablet  0   No current facility-administered medications for this visit.     Past Medical History  Diagnosis Date  . Left arm pain   . CAD (coronary artery disease)     cath 05/2011: D1 90%, proximal circumflex occluded with right-to-left collaterals, EF 60%.  Her circumflex was felt to be a chronic occlusion and was treated medically.  She received 3 overlapping drug-eluting stents to the first diagonal  . Hypertension   . Hyperlipidemia   . Arthritis     Past Surgical History  Procedure Laterality Date  . Cesarean section      History   Social History  . Marital Status: Married    Spouse Name: N/A    Number of Children: N/A  . Years of Education: N/A   Occupational History  . Not on file.   Social History Main Topics  .  Smoking status: Never Smoker   . Smokeless tobacco: Not on file  . Alcohol Use: Yes  . Drug Use: No  . Sexual Activity:    Other Topics Concern  . Not on file   Social History Narrative  . No narrative on file    ROS: no fevers or chills, productive cough, hemoptysis, dysphasia, odynophagia, melena, hematochezia, dysuria, hematuria, rash, seizure activity, orthopnea, PND, pedal edema, claudication. Remaining systems are negative.  Physical Exam: Well-developed well-nourished in no acute distress.  Skin is warm and dry.  HEENT is normal.  Neck is supple.  Chest is clear to auscultation with normal expansion.  Cardiovascular exam is regular rate and rhythm.  Abdominal exam nontender or distended. No masses palpated. Extremities show no edema. neuro grossly intact  ECG Sinus rhythm at a rate of 57. RV conduction delay. No ST changes.

## 2014-05-11 ENCOUNTER — Telehealth: Payer: Self-pay | Admitting: Cardiology

## 2014-05-11 MED ORDER — LISINOPRIL 10 MG PO TABS: 10.0000 mg | ORAL_TABLET | Freq: Every day | ORAL | Status: DC

## 2014-05-11 NOTE — Telephone Encounter (Signed)
Ok to DC amlodipine if side effects unacceptable; options for other antianginals limited; did not tolerate imdur in past; HR 57 at last OV and therefore cannot increase atenolol. Yesenia Evans

## 2014-05-11 NOTE — Telephone Encounter (Signed)
Spoke with pt, she was started on amlodipine 5 mg to help with her angina. She is feeling better and is having no problems with angina but she is now having swelling in her feet by the end of the day. In the morning they are good but uncomfortable at days end. She also has a knot feeling in her throat when swallowing, denies SOB. Will forward for dr Jens Somcrenshaw review

## 2014-05-11 NOTE — Telephone Encounter (Signed)
Spoke with pt, Aware of dr crenshaw's recommendations.  °

## 2014-05-11 NOTE — Telephone Encounter (Signed)
Spoke with pt, Aware of dr Ludwig Clarkscrenshaw's recommendations.  She wants to know if she should restart the lisinopril, she is going to stop the amlodipine. Will forward for dr Jens Somcrenshaw review

## 2014-05-11 NOTE — Telephone Encounter (Signed)
Ok to resume lisinopril if norvasc DCed. Yesenia Evans

## 2014-05-11 NOTE — Telephone Encounter (Signed)
New message      Question about her bp medication

## 2014-07-10 ENCOUNTER — Other Ambulatory Visit: Payer: Self-pay

## 2014-07-10 MED ORDER — LISINOPRIL 10 MG PO TABS: 10.0000 mg | ORAL_TABLET | Freq: Every day | ORAL | Status: DC

## 2014-07-10 MED ORDER — ATENOLOL 25 MG PO TABS: ORAL_TABLET | ORAL | Status: AC

## 2014-07-12 ENCOUNTER — Other Ambulatory Visit: Payer: Self-pay | Admitting: *Deleted

## 2014-07-12 MED ORDER — LISINOPRIL 10 MG PO TABS: 10.0000 mg | ORAL_TABLET | Freq: Every day | ORAL | Status: AC

## 2017-08-07 ENCOUNTER — Encounter (HOSPITAL_COMMUNITY): Payer: Self-pay | Admitting: Emergency Medicine

## 2017-08-07 ENCOUNTER — Emergency Department (HOSPITAL_COMMUNITY)
Admission: EM | Admit: 2017-08-07 | Discharge: 2017-08-07 | Disposition: A | Discharge status: Home / Self Care | Payer: BLUE CROSS/BLUE SHIELD | Attending: Emergency Medicine | Admitting: Emergency Medicine

## 2017-08-07 DIAGNOSIS — R1013 Epigastric pain: Secondary | ICD-10-CM | POA: Insufficient documentation

## 2017-08-07 DIAGNOSIS — Z79899 Other long term (current) drug therapy: Secondary | ICD-10-CM | POA: Diagnosis not present

## 2017-08-07 DIAGNOSIS — I2510 Atherosclerotic heart disease of native coronary artery without angina pectoris: Secondary | ICD-10-CM | POA: Diagnosis not present

## 2017-08-07 DIAGNOSIS — I10 Essential (primary) hypertension: Secondary | ICD-10-CM | POA: Diagnosis not present

## 2017-08-07 DIAGNOSIS — Z7982 Long term (current) use of aspirin: Secondary | ICD-10-CM | POA: Insufficient documentation

## 2017-08-07 LAB — COMPREHENSIVE METABOLIC PANEL
ALBUMIN: 3.9 g/dL (ref 3.5–5.0)
ALK PHOS: 58 U/L (ref 38–126)
ALT: 17 U/L (ref 14–54)
AST: 18 U/L (ref 15–41)
Anion gap: 5 (ref 5–15)
BUN: 17 mg/dL (ref 6–20)
CALCIUM: 8.9 mg/dL (ref 8.9–10.3)
CO2: 28 mmol/L (ref 22–32)
Chloride: 105 mmol/L (ref 101–111)
Creatinine, Ser: 1.22 mg/dL — ABNORMAL HIGH (ref 0.44–1.00)
GFR calc non Af Amer: 51 mL/min — ABNORMAL LOW (ref >60)
GFR, EST AFRICAN AMERICAN: 59 mL/min — AB (ref >60)
GLUCOSE: 106 mg/dL — AB (ref 65–99)
Potassium: 4.4 mmol/L (ref 3.5–5.1)
SODIUM: 138 mmol/L (ref 135–145)
Total Bilirubin: 0.7 mg/dL (ref 0.3–1.2)
Total Protein: 5.9 g/dL — ABNORMAL LOW (ref 6.5–8.1)

## 2017-08-07 LAB — CBC
HCT: 36.1 % (ref 36.0–46.0)
HEMOGLOBIN: 12.1 g/dL (ref 12.0–15.0)
MCH: 32 pg (ref 26.0–34.0)
MCHC: 33.5 g/dL (ref 30.0–36.0)
MCV: 95.5 fL (ref 78.0–100.0)
Platelets: 109 10*3/uL — ABNORMAL LOW (ref 150–400)
RBC: 3.78 MIL/uL — ABNORMAL LOW (ref 3.87–5.11)
RDW: 13 % (ref 11.5–15.5)
WBC: 4.6 10*3/uL (ref 4.0–10.5)

## 2017-08-07 LAB — LIPASE, BLOOD: Lipase: 46 U/L (ref 11–51)

## 2017-08-07 LAB — I-STAT TROPONIN, ED
Troponin i, poc: 0 ng/mL (ref 0.00–0.08)
Troponin i, poc: 0 ng/mL (ref 0.00–0.08)

## 2017-08-07 LAB — URINALYSIS, ROUTINE W REFLEX MICROSCOPIC
BILIRUBIN URINE: NEGATIVE
Glucose, UA: NEGATIVE mg/dL
HGB URINE DIPSTICK: NEGATIVE
Ketones, ur: NEGATIVE mg/dL
Leukocytes, UA: NEGATIVE
Nitrite: NEGATIVE
PH: 6 (ref 5.0–8.0)
Protein, ur: NEGATIVE mg/dL
SPECIFIC GRAVITY, URINE: 1.015 (ref 1.005–1.030)

## 2017-08-07 MED ORDER — GI COCKTAIL ~~LOC~~
30.0000 mL | Freq: Once | ORAL | Status: AC
  Administered 2017-08-07: 30 mL via ORAL
  Filled 2017-08-07: qty 30

## 2017-08-07 NOTE — Discharge Instructions (Signed)
Your epigastric discomfort was likely due to reflux and your low blood pressure from the nitroglycerin. Your heart enzymes and EKG are normal.  You can also stop taking the ciprofloxacin. Your urinalysis is normal and you have been treated for an adequate amount of time.  Please continue to follow up with your primary doctor as needed.

## 2017-08-07 NOTE — ED Provider Notes (Signed)
I have personally seen and examined the patient. I have reviewed the documentation on PMH/FH/Soc Hx. I have discussed the plan of care with the resident and patient.  I have reviewed and agree with the resident's documentation. Please see associated encounter note.   EKG Interpretation  Date/Time:  Friday August 07 2017 06:24:10 EDT Ventricular Rate:  61 PR Interval:    QRS Duration: 103 QT Interval:  473 QTC Calculation: 477 R Axis:   52 Text Interpretation:  Sinus rhythm Low voltage, precordial leads Nonspecific T abnormalities, lateral leads No significant change since last tracing Confirmed by Zadie Rhine ([id number]) on 08/07/2017 6:39:13 AM         Eudelia Bunch, Amadeo Garnet, MD 08/07/17 1014

## 2017-08-07 NOTE — ED Notes (Signed)
Gave Pt water per ok Redmond Baseman RN

## 2017-08-07 NOTE — ED Triage Notes (Signed)
Patient arrived with EMS from home reports nausea /emesis x1 with lightheadedness and mild dizziness this morning . Denies abdominal pain / no diarrhea . She took 1 NTG sl and ASA 324 mg prir to arrival , hypotensive at home 80/40 , received 400 ml NS IV by EMS BP improved to 112/77 at arrival .

## 2017-08-07 NOTE — ED Provider Notes (Signed)
[facility]-EMERGENCY DEPT Provider Note   CSN: [id number] Arrival date & time: 08/07/17  0605     History   Chief Complaint Chief Complaint  Patient presents with  . Emesis   HPI  Yesenia Evans is a 51yo female with PMH significant for CAD s/p DES, HTN, HLD, and GERD who presents with abdominal pain and nausea. She was in her usual state of health until this morning around 3am when she had a dull epigastric pain that woke her from sleep. Pain does not radiate to back. She felt nauseous but no emesis. No changes in BMs. She later was walking around when she felt very cold, clammy, and lightheaded as well as a "wave" come across her chest. She took 1 SL NTG and ASA  prior to arrival. [facility] and was found to be hypotensive to 80/40. Received IV NS by EMS with improvement in BP to 112/77. She denies chest pain, shortness of breath, fevers, sick contacts, cough, congestion. rashes, myalgias, or recent travel. No excessive use of NSAIDs. Has had 2 C-sections in the past, no other abdominal surgeries.  Had a UTI ~1 week ago, diagnosed by PCP. Taking ciprofloxacin (3 days left). No urinary symptoms. Does have reflux symptoms occasionally - takes prilosec as needed. No recent changes in medications, other than the cipro.  Does not smoke, drinks alcohol socially, no other illicit drugs. Lives at home with husband and 2 kids. 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 Had been doing some heavy lifting while her mom is moving to a new place, but that was 2 weeks ago.  Past Medical History:  Diagnosis Date  . Arthritis   . CAD (coronary artery disease)    cath 05/2011: D1 90%, proximal circumflex occluded with right-to-left collaterals, EF 60%.  Her circumflex was felt to be a chronic occlusion and was treated medically.  She received 3 overlapping drug-eluting stents to the first diagonal  . Hyperlipidemia   . Hypertension   . Left arm pain     Patient Active Problem List   Diagnosis Date Noted  . Arm  pain 08/27/2011  . CAD (coronary artery disease) 08/27/2011  . HTN (hypertension) 06/06/2011  . Mixed hyperlipidemia 06/06/2011  . Chest pain 06/06/2011    Past Surgical History:  Procedure Laterality Date  . CESAREAN SECTION    . CORONARY STENT PLACEMENT      OB History    No data available       Home Medications    Prior to Admission medications   Medication Sig Start Date End Date Taking? Authorizing Provider  aspirin 81 MG tablet Take 81 mg by mouth daily.      [provider]  atenolol (TENORMIN) 25 MG tablet TAKE 1 TABLET DAILY 07/10/14   Lewayne Bunting, MD  atorvastatin (LIPITOR) 80 MG tablet Take 1 tablet (80 mg total) by mouth daily. 09/21/13 09/21/14  Lewayne Bunting, MD  lisinopril (PRINIVIL,ZESTRIL) 10 MG tablet Take 1 tablet (10 mg total) by mouth daily. 07/12/14   Lewayne Bunting, MD  NITROSTAT 0.4 MG SL tablet DISSOLVE 1 TABLET UNDER THETONGUE AS NEEDED 11/15/13   Wendall Stade, MD  valACYclovir (VALTREX) 500 MG tablet Take 500 mg by mouth daily.      [provider]    Family History Family History  Problem Relation Age of Onset  . Heart disease Neg Hx     Social History Social History  Substance Use Topics  . Smoking  status: Never Smoker  . Smokeless tobacco: Never Used  . Alcohol use Yes     Allergies   Adhesive [tape]; Ceclor [cefaclor]; Effient [prasugrel hydrochloride]; and Imdur [isosorbide mononitrate]   Review of Systems Review of Systems  Unremarkable except as stated above in HPI.  Physical Exam Updated Vital Signs BP 114/85 (BP Location: Right Arm)   Pulse 63   Temp (!) 97.4 F (36.3 C) (Oral)   Resp 16   SpO2 100%   Physical Exam  GEN: Well-appearing female lying comfortably in bed in NAD. Alert and oriented. HENT: Moist mucous membranes. No visible lesions. EYES: PERRL. Sclera anicteric. NECK: No cervical lymphadenopathy. RESP: Clear to auscultation bilaterally. No wheezes, rales, or  rhonchi. CV: Normal rate and regular rhythm. No murmurs, gallops, or rubs. No tenderness to palpation of anterior chest wall. 2+ BLE edema to knees with some tenderness. ABD: Soft. Non-tender. Non-distended. Normoactive bowel sounds. MSK: 2+ BLE edema. Warm. 2+ DP and radial pulses. No CVA tenderness. Capillary refill ~3sec. NEURO: Cranial nerves II-XII grossly intact. Able to lift all four extremities against gravity. Speech fluent and appropriate.  ED Treatments / Results  Labs (all labs ordered are listed, but only abnormal results are displayed) Labs Reviewed  COMPREHENSIVE METABOLIC PANEL - Abnormal; Notable for the following:       Result Value   Glucose, Bld 106 (*)    Creatinine, Ser 1.22 (*)    Total Protein 5.9 (*)    GFR calc non Af Amer 51 (*)    GFR calc Af Amer 59 (*)    All other components within normal limits  CBC - Abnormal; Notable for the following:    RBC 3.78 (*)    Platelets 109 (*)    All other components within normal limits  LIPASE, BLOOD  URINALYSIS, ROUTINE W REFLEX MICROSCOPIC  I-STAT TROPONIN, ED  I-STAT TROPONIN, ED    EKG  EKG Interpretation  Date/Time:  Friday August 07 2017 06:24:10 EDT Ventricular Rate:  61 PR Interval:    QRS Duration: 103 QT Interval:  473 QTC Calculation: 477 R Axis:   52 Text Interpretation:  Sinus rhythm Low voltage, precordial leads Nonspecific T abnormalities, lateral leads No significant change since last tracing Reconfirmed by Drema Pry [phone]) on 08/07/2017 8:35:01 AM       Radiology No results found.  Procedures Procedures (including critical care time)  Medications Ordered in ED Medications  gi cocktail (Maalox,Lidocaine,Donnatal) (30 mLs Oral Given 08/07/17 0848)     Initial Impression / Assessment and Plan / ED Course  I have reviewed the triage vital signs and the nursing notes.  Pertinent labs & imaging results that were available during my care of the patient were reviewed by me and  considered in my medical decision making (see chart for details).  Yesenia Evans is a 51yo female with PMH of CAD s/p DES, HTN, HLD, and GERD who presents with epigastric pain and episode of lightheadedness. Took SL NTG at home. Found to be hypotensive to 80/40 after SL NTG, received 400cc IV fluids by EMS. Denies current lightheadedness or nausea. Only complaining of dull cramping epigastric pain now. Troponin negative, EKG normal. CBC wnl. Recently treated for UTI (on cipro). UA normal. Lipase nl. CMP with Cr elevated to 1.22. Likely prerenal. Afebrile. Hypotension probably secondary to SL NTG intake. BP in 110s-120s here (has not taken her home lisinopril or atenolol yet). S/p 400cc IVF with improvement in symptoms. Will get orthostatics. If orthostatic +, will  give IVF. Otherwise, encourage PO intake. With heart history, will repeat troponin in 3 hours.  Her epigastric pain without current N/V/D and cold/clammy feeling is possibly secondary to GERD or ?from cipro. Will give GI cocktail and assess for symptomatic improvement. Patient has taken 7 days of cipro for UTI, will tell patient on discharge that she can stop taking the cipro.  9:23am Orthostatics negative. Encouraged PO intake. Patient denies chest pain or dizziness at this time. States epigastric has improved after GI cocktail. Pending delta troponin at 10am.  10:07am Second troponin negative. Tolerating PO fluids and denies dizziness or epigastric cramping. Epigastric discomfort likely secondary to dyspepsia after eating [employer] last night. Informed patient to discontinue ciprofloxacin and return precautions provided. Patient stable for discharge.  Patient seen and discussed with Dr. Eudelia Bunch.  Final Clinical Impressions(s) / ED Diagnoses   Final diagnoses:  Epigastric discomfort    New Prescriptions New Prescriptions   No medications on file     Scherrie Gerlach, MD 08/07/17 1011

## 2017-08-07 NOTE — ED Notes (Signed)
Pt ambulated to restroom in room. Steady gait and tolerated well.
